# Patient Record
Sex: Female | Born: 2000 | Race: White | Hispanic: No | Marital: Single | Smoking: Former smoker
Health system: Southern US, Community
[De-identification: ages and names within clinical notes are randomized; demographics above are authoritative.]

## PROBLEM LIST (undated history)

## (undated) DIAGNOSIS — Z87440 Personal history of urinary (tract) infections: Secondary | ICD-10-CM

## (undated) DIAGNOSIS — Z9109 Other allergy status, other than to drugs and biological substances: Secondary | ICD-10-CM

## (undated) DIAGNOSIS — L509 Urticaria, unspecified: Secondary | ICD-10-CM

## (undated) DIAGNOSIS — F32A Depression, unspecified: Secondary | ICD-10-CM

## (undated) DIAGNOSIS — F329 Major depressive disorder, single episode, unspecified: Secondary | ICD-10-CM

## (undated) DIAGNOSIS — F909 Attention-deficit hyperactivity disorder, unspecified type: Secondary | ICD-10-CM

## (undated) DIAGNOSIS — T7840XA Allergy, unspecified, initial encounter: Secondary | ICD-10-CM

## (undated) DIAGNOSIS — L709 Acne, unspecified: Secondary | ICD-10-CM

## (undated) DIAGNOSIS — R010 Benign and innocent cardiac murmurs: Secondary | ICD-10-CM

## (undated) DIAGNOSIS — F419 Anxiety disorder, unspecified: Secondary | ICD-10-CM

## (undated) HISTORY — DX: Major depressive disorder, single episode, unspecified: F32.9

## (undated) HISTORY — DX: Attention-deficit hyperactivity disorder, unspecified type: F90.9

## (undated) HISTORY — PX: CLAVICLE SURGERY: SHX598

## (undated) HISTORY — DX: Allergy, unspecified, initial encounter: T78.40XA

## (undated) HISTORY — DX: Urticaria, unspecified: L50.9

## (undated) HISTORY — DX: Acne, unspecified: L70.9

## (undated) HISTORY — DX: Personal history of urinary (tract) infections: Z87.440

## (undated) HISTORY — DX: Depression, unspecified: F32.A

---
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
## 2021-01-02 DIAGNOSIS — Z719 Counseling, unspecified: Secondary | ICD-10-CM

## 2021-01-04 ENCOUNTER — Encounter: Payer: Self-pay | Admitting: Registered Nurse

## 2021-01-09 ENCOUNTER — Other Ambulatory Visit: Payer: Self-pay | Admitting: Registered Nurse

## 2021-01-09 DIAGNOSIS — R41840 Attention and concentration deficit: Secondary | ICD-10-CM

## 2021-01-09 MED ORDER — AMPHETAMINE-DEXTROAMPHET ER 10 MG PO CP24: 10.0000 mg | ORAL_CAPSULE | Freq: Every day | ORAL | 0 refills | Status: DC

## 2021-01-09 NOTE — Progress Notes (Signed)
Vyvanse not covered without PA - will send Adderall 10mg  XR PO qd instead, monitor effect   , NP

## 2021-01-24 ENCOUNTER — Encounter: Payer: Self-pay | Admitting: Registered Nurse

## 2021-02-04 DIAGNOSIS — N939 Abnormal uterine and vaginal bleeding, unspecified: Secondary | ICD-10-CM | POA: Diagnosis not present

## 2021-02-06 ENCOUNTER — Encounter: Payer: Self-pay | Admitting: Registered Nurse

## 2021-02-06 ENCOUNTER — Other Ambulatory Visit: Payer: Self-pay

## 2021-02-06 ENCOUNTER — Telehealth: Payer: Self-pay | Admitting: Registered Nurse

## 2021-02-06 VITALS — Ht 66.01 in | Wt 111.8 lb

## 2021-02-06 DIAGNOSIS — N946 Dysmenorrhea, unspecified: Secondary | ICD-10-CM

## 2021-02-06 DIAGNOSIS — R41840 Attention and concentration deficit: Secondary | ICD-10-CM

## 2021-02-06 MED ORDER — LISDEXAMFETAMINE DIMESYLATE 40 MG PO CAPS: 40.0000 mg | ORAL_CAPSULE | ORAL | 0 refills | Status: DC

## 2021-02-06 NOTE — Patient Instructions (Addendum)
Ms. Gwynne -   Always a pleasure to speak with you.  Sorry you're not doing great.  Vyvanse has been sent. Can pursue prior authorization now that we have "failed" adderall.  Sending pelvic ultrasound orders to: [facility] [address] [place], [place] [address] Tel: [phone] Fax: [phone] Email: [email or web address]   Sending lab orders to: [facility] [address] [place], [place] [address] Phone   [phone] Fax   [phone]   Would recommend calling to schedule for imaging, but walk in for labs should be ok.  Can give each location our fax # [phone] - especially the imaging center - to ensure I get results promptly  Thank you  Luan Pulling

## 2021-02-06 NOTE — Progress Notes (Signed)
Telemedicine Encounter- SOAP NOTE Established Patient  This telephone encounter was conducted with the patient's (or proxy's) verbal consent via audio telecommunications: yes/no: Yes Patient was instructed to have this encounter in a suitably private space; and to only have persons present to whom they give permission to participate. In addition, patient identity was confirmed by use of name plus two identifiers (DOB and address).  I discussed the limitations, risks, security and privacy concerns of performing an evaluation and management service by telephone and the availability of in person appointments. I also discussed with the patient that there may be a patient responsible charge related to this service. The patient expressed understanding and agreed to proceed.  I spent a total of  22 minutes talking with the patient or their proxy.  Patient at home Provider in office  Participants: Jari Sportsman, NP and Pearlean Boy  Chief Complaint  Patient presents with   ADHD   Medication Refill   Menstrual Problem    1 week early, heavy.    Fatigue    She complains of extreme fatigue. She says that she slept over 8 hours last night, and feels like she slept 2 hours.     Subjective   Kehinde Jentsch is a 20 y.o. established patient. Telephone visit today for med check, menstrual concerns, fatigue  HPI ADD Started Adderall 10mg  ER po qd on 12/14/20 - initially tried for vyvanse but unable to get this covered, so she had started later than anticipated. PDMP shows pick up of 30 tablets on 01/11/21. Unfortunately subtherapeutic effect - had titrated up to 30mg  PO qd - still not lasting long enough  Abnormal Menses Most recent period very heavy, lots of clotting. Hx of very regular menses - had been on COCs for some time but stopped two years ago, still regular until recent. LMP on 02/02/21 Cramping is severe - far worse on R side. Far worse than usual cramping. Recent visit to urgent care  showed negative for STI, negative for pregnancy   Is seen at [facility] - 04/04/21 Al[place], PA-C  Hx of anemia  Fatigue Sleeping quite a lot - 8-10 hours last night, but still very fatigued.  Only these past few nights Thinks it's related to her recent heavy menses.  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   Intractable cyclical vomiting with nausea 05/28/2017   Marijuana abuse 05/28/2017   Seasonal allergic rhinitis due to pollen 05/28/2017   Intermittent vomiting 01/13/2017   MDD (major depressive disorder), recurrent episode, severe (HCC) 07/31/2016   Anemia 04/17/2016   GAD (generalized anxiety disorder) 03/15/2016   Anaphylaxis 02/28/2016   Angioedema 02/28/2016   Urticaria 02/28/2016   Acne vulgaris 07/06/2015   Metrorrhagia 07/06/2015    Past Medical History:  Diagnosis Date   Acne    Allergy    Anxiety    Depression    Environmental allergies    History of recurrent UTIs    Hives    Innocent heart murmur     Current Outpatient Medications  Medication Sig Dispense Refill   albuterol (VENTOLIN HFA) 108 (90 Base) MCG/ACT inhaler INHALE 2 PUFFS BY MOUTH 10 TO 15 MINUTES BEFORE PHYSICAL ACTIVITY 6.7 g 3   amphetamine-dextroamphetamine (ADDERALL XR) 10 MG 24 hr capsule Take 1 capsule (10 mg total) by mouth daily. 30 capsule 0   doxycycline (VIBRA-TABS) 100 MG tablet Take 1 tablet (100 mg total) by mouth  2 (two) times daily. (Patient not taking: Reported on 02/06/2021) 14 tablet 0   fluconazole (DIFLUCAN) 150 MG tablet Take 2 tablets (300 mg total) by mouth once a week. (Patient not taking: Reported on 02/06/2021) 4 tablet 0   itraconazole (SPORANOX) 100 MG capsule Take 2 capsules (200 mg total) by mouth daily. (Patient not taking: Reported on 02/06/2021) 10 capsule 0   triamcinolone cream (KENALOG) 0.1 % Apply 1 application topically 2 (two) times daily. (Patient not taking: Reported on 02/06/2021) 45 g  2   No current facility-administered medications for this visit.    Allergies  Allergen Reactions   Other Hives and Anaphylaxis    Carries Epi-Pen for when she gets hives. Source of hives is undetermined despite testing. Ragweed    Social History   Socioeconomic History   Marital status: Single    Spouse name: Not on file   Number of children: 0   Years of education: Not on file   Highest education level: Not on file  Occupational History   Not on file  Tobacco Use   Smoking status: Former    Types: E-cigarettes   Smokeless tobacco: Never  Vaping Use   Vaping Use: Former  Substance and Sexual Activity   Alcohol use: Yes    Comment: Every now and then   Drug use: Yes    Types: Marijuana    Comment: none in several weeks   Sexual activity: Yes    Birth control/protection: Condom  Other Topics Concern   Not on file  Social History Narrative   Not on file   Social Determinants of Health   Financial Resource Strain: Not on file  Food Insecurity: Not on file  Transportation Needs: Not on file  Physical Activity: Not on file  Stress: Not on file  Social Connections: Not on file  Intimate Partner Violence: Not on file    ROS Per hpi   Objective   Vitals as reported by the patient: Today's Vitals   02/06/21 1557  Weight: 111 lb 12.4 oz (50.7 kg)  Height: 5' 6.01" (1.677 m)    There are no diagnoses linked to this encounter.  PLAN Will switch to Vyvanse as above. Pt has failed trial of adderall xr.  Heavy menses - given sharp unilateral pain, will order [place] to imaging center in [place] (where pt is currently living) to rule out torsion, ruptured cyst, etc. Labs to be ordered to [facility] in [place] as well. Instructed pt to give imaging and labs our fax number to ensure we get results. ER precautions reviewed Patient encouraged to call clinic with any questions, comments, or concerns.  I discussed the assessment and treatment plan with the patient. The  patient was provided an opportunity to ask questions and all were answered. The patient agreed with the plan and demonstrated an understanding of the instructions.   The patient was advised to call back or seek an in-person evaluation if the symptoms worsen or if the condition fails to improve as anticipated.  I provided 22 minutes of non-face-to-face time during this encounter.  [name], NP  Primary Care at [facility]

## 2021-02-14 ENCOUNTER — Telehealth: Payer: Self-pay | Admitting: Registered Nurse

## 2021-02-14 DIAGNOSIS — R41840 Attention and concentration deficit: Secondary | ICD-10-CM

## 2021-02-14 NOTE — Telephone Encounter (Signed)
Patient called - she is switched insurance coverages and her insurance will not go into effect until Monday at the latest.  Her vyvanse  will be $500 without insurance.  She wants to know if you can call in a rx for 7 days.  Can you send this into [facility] [employer] [facility]	[place].  She would like a higher dose of the Adderall - Vyvanse has not been filled yet and she will get in on Monday.

## 2021-02-17 NOTE — Telephone Encounter (Signed)
Patient is calling back for follow up on medication

## 2021-02-18 MED ORDER — AMPHETAMINE-DEXTROAMPHET ER 30 MG PO CP24: 30.0000 mg | ORAL_CAPSULE | ORAL | 0 refills | Status: DC

## 2021-02-18 NOTE — Telephone Encounter (Signed)
Noted last visit August 15 and prior visits reviewed. Had initially attempted Vyvanse, unable to get that covered, changed to Adderall 10 mg extended release, and had titrated up to 30 mg daily but not lasting long enough.  Switched back to Vyvanse at August 15 visit at 40 mg.  Controlled substance database reviewed, last filled dextroamphetamine extended release 10 mg caps #30 on 01/11/2021. Insurance recently changed, should be effective this Monday.  In school - just finished 1st week of classes [employer].  Struggling off meds past week with focus - putting off work.   Plan discussed with patient, although did not ideal relief with the 30 mg of Adderall, would at least like to try that for now until she is able to get Vyvanse covered.  1 week supply provided.  Recommended 6-week follow-up after starting Vyvanse with [name], NP.  Also recommended to proceed with the formal ADD evaluation as previously discussed.  All questions answered.

## 2021-02-19 ENCOUNTER — Encounter: Payer: Self-pay | Admitting: Registered Nurse

## 2021-02-19 DIAGNOSIS — R41840 Attention and concentration deficit: Secondary | ICD-10-CM

## 2021-02-21 MED ORDER — AMPHETAMINE-DEXTROAMPHET ER 30 MG PO CP24: 30.0000 mg | ORAL_CAPSULE | ORAL | 0 refills | Status: DC

## 2021-02-21 MED ORDER — LISDEXAMFETAMINE DIMESYLATE 40 MG PO CAPS: 40.0000 mg | ORAL_CAPSULE | ORAL | 0 refills | Status: DC

## 2021-02-21 NOTE — Telephone Encounter (Signed)
I called patient, apologize for the wrong pharmacy.  I have sent the Adderall XR 7-day supply to the [facility] as well as Vyvanse with instructions on timing of fill.  All questions answered.  Advised to let us know if Vyvanse will not be covered so we can make alternate arrangements.  Future message can be sent to her PCP, [name].

## 2021-02-21 NOTE — Telephone Encounter (Signed)
Patient needs adderall (7day) prescription and vyvanse prescription sent to [facility].

## 2021-02-23 ENCOUNTER — Encounter: Payer: Self-pay | Admitting: Registered Nurse

## 2021-02-26 DIAGNOSIS — Z20822 Contact with and (suspected) exposure to covid-19: Secondary | ICD-10-CM | POA: Diagnosis not present

## 2021-02-28 NOTE — Telephone Encounter (Signed)
Patient called back about prior authorization - Patient states that she needs to be able to pick up her medication today.  Please advise.

## 2021-02-28 NOTE — Telephone Encounter (Signed)
Having additional problems getting through. I keep getting a message telling me they are hanging up to try my call again later.

## 2021-02-28 NOTE — Telephone Encounter (Signed)
Called pharmacy to check status of PA, due to high wait time, had to hang up. Will check back later.

## 2021-03-01 ENCOUNTER — Telehealth: Payer: Self-pay

## 2021-03-01 NOTE — Telephone Encounter (Signed)
Wess a Teacher, music from [facility] called and asked to see if patient has taken or can take Focalin XR.   Albertina Parr said per Shanda Bumps that he needed to speak to Dr Neva Seat?   Call back number is [phone] Ref [id number]

## 2021-03-01 NOTE — Telephone Encounter (Signed)
Took call from [place] with [employer]. She was calling due to prior authorization for vyvanse being denied. [facility] is a peer to peer scheduled for 4:30 today, which was the latest. Determination will be made during call or shortly after.

## 2021-03-02 ENCOUNTER — Ambulatory Visit: Payer: Self-pay | Admitting: Registered Nurse

## 2021-03-02 ENCOUNTER — Telehealth: Payer: Self-pay

## 2021-03-02 ENCOUNTER — Other Ambulatory Visit: Payer: Self-pay | Admitting: Registered Nurse

## 2021-03-02 DIAGNOSIS — R41840 Attention and concentration deficit: Secondary | ICD-10-CM

## 2021-03-02 MED ORDER — DEXMETHYLPHENIDATE HCL ER 15 MG PO CP24: 15.0000 mg | ORAL_CAPSULE | Freq: Every day | ORAL | 0 refills | Status: DC

## 2021-03-02 NOTE — Telephone Encounter (Signed)
I received a call from patient and she was very upset about not getting her medication for a month. Patient states that the insurance company has been trying to get a peer to peer with our office and we have not done our part to insure she has her medication. Per the last encounters pharmacy at [facility] wanted to know if patient had taken Focalin XR in the past. Patient reports that she has had Focalin at some point, however, it did not work for her in the past and she would not like to be put back on it. I explained the insurance process to patient and advised her that we have been attempting to get this covered for her without success but we will continue to speak to insurance and I would update her.   I called insurance company and they stated a peer to peer is needed with [name], NP to be able to appeal this medication denial. They will be calling our office for peer to peer at 3pm today to discuss further in hopes of getting medication filled.   I gave patient an update through mychart and let her know we would communicate the status of her medications once we had more information from peer to peer.

## 2021-03-02 NOTE — Telephone Encounter (Signed)
Pt is wanting to know if she can get a week long adderall booster until you meet with the insurance company regarding the approval.  [id number] [place], [place] - [address] AT [facility] [address]   Pt call [phone]

## 2021-03-02 NOTE — Telephone Encounter (Signed)
Sent PA but Nehemiah Settle was spearheading. Now have sent Focalin XR.  Will reach out to patient directly to address her reasonable concern  Thank you  Rich

## 2021-03-02 NOTE — Telephone Encounter (Signed)
[employer] [place] [id number] Group [id number]

## 2021-03-16 ENCOUNTER — Ambulatory Visit: Payer: Managed Care, Other (non HMO) | Admitting: Plastic Surgery

## 2021-03-16 ENCOUNTER — Encounter: Payer: Managed Care, Other (non HMO) | Admitting: Plastic Surgery

## 2021-03-30 ENCOUNTER — Encounter: Payer: Self-pay | Admitting: Registered Nurse

## 2021-03-30 ENCOUNTER — Other Ambulatory Visit: Payer: Self-pay | Admitting: Family Medicine

## 2021-03-30 DIAGNOSIS — R41840 Attention and concentration deficit: Secondary | ICD-10-CM

## 2021-03-30 MED ORDER — LISDEXAMFETAMINE DIMESYLATE 40 MG PO CAPS: 40.0000 mg | ORAL_CAPSULE | ORAL | 0 refills | Status: DC

## 2021-03-31 NOTE — Telephone Encounter (Signed)
Medication was refilled yesterday according to her chart.

## 2021-04-06 ENCOUNTER — Other Ambulatory Visit: Payer: Self-pay

## 2021-04-06 ENCOUNTER — Emergency Department (HOSPITAL_COMMUNITY)
Admission: EM | Admit: 2021-04-06 | Discharge: 2021-04-07 | Disposition: A | Discharge status: Home / Self Care | Payer: BC Managed Care – PPO | Attending: Emergency Medicine | Admitting: Emergency Medicine

## 2021-04-06 ENCOUNTER — Encounter (HOSPITAL_COMMUNITY): Payer: Self-pay | Admitting: *Deleted

## 2021-04-06 DIAGNOSIS — S61431A Puncture wound without foreign body of right hand, initial encounter: Secondary | ICD-10-CM | POA: Diagnosis not present

## 2021-04-06 DIAGNOSIS — S01511A Laceration without foreign body of lip, initial encounter: Secondary | ICD-10-CM | POA: Insufficient documentation

## 2021-04-06 DIAGNOSIS — Z23 Encounter for immunization: Secondary | ICD-10-CM | POA: Insufficient documentation

## 2021-04-06 DIAGNOSIS — M7989 Other specified soft tissue disorders: Secondary | ICD-10-CM | POA: Diagnosis not present

## 2021-04-06 DIAGNOSIS — S0993XA Unspecified injury of face, initial encounter: Secondary | ICD-10-CM | POA: Diagnosis not present

## 2021-04-06 DIAGNOSIS — S0185XA Open bite of other part of head, initial encounter: Secondary | ICD-10-CM | POA: Diagnosis not present

## 2021-04-06 DIAGNOSIS — S1193XA Puncture wound without foreign body of unspecified part of neck, initial encounter: Secondary | ICD-10-CM | POA: Insufficient documentation

## 2021-04-06 DIAGNOSIS — S61451A Open bite of right hand, initial encounter: Secondary | ICD-10-CM | POA: Diagnosis not present

## 2021-04-06 DIAGNOSIS — S01412A Laceration without foreign body of left cheek and temporomandibular area, initial encounter: Secondary | ICD-10-CM | POA: Diagnosis not present

## 2021-04-06 DIAGNOSIS — Z87891 Personal history of nicotine dependence: Secondary | ICD-10-CM | POA: Insufficient documentation

## 2021-04-06 DIAGNOSIS — W540XXA Bitten by dog, initial encounter: Secondary | ICD-10-CM | POA: Insufficient documentation

## 2021-04-06 MED ORDER — ONDANSETRON 4 MG PO TBDP
4.0000 mg | ORAL_TABLET | Freq: Once | ORAL | Status: AC
  Administered 2021-04-06: 4 mg via ORAL
  Filled 2021-04-06: qty 1

## 2021-04-06 MED ORDER — OXYCODONE-ACETAMINOPHEN 5-325 MG PO TABS
1.0000 | ORAL_TABLET | Freq: Once | ORAL | Status: AC
Start: 2021-04-06 — End: 2021-04-06
  Administered 2021-04-06: 1 via ORAL
  Filled 2021-04-06: qty 1

## 2021-04-06 MED ORDER — LIDOCAINE-EPINEPHRINE (PF) 2 %-1:200000 IJ SOLN
20.0000 mL | Freq: Once | INTRAMUSCULAR | Status: AC
  Administered 2021-04-07: 20 mL
  Filled 2021-04-06: qty 20

## 2021-04-06 NOTE — ED Notes (Signed)
Pt's hand wound oozing blood around dressing. Dressing changed and wrapped with nonadherent, gauze, and coban.

## 2021-04-06 NOTE — ED Provider Notes (Addendum)
Emergency Medicine Provider Triage Evaluation Note  Sheryl Jackson , a 20 y.o. female  was evaluated in triage.  Pt complains of multiple puncture wounds secondary to a dog bite.  They were watching a dog for another family.  She arrived home from college when the dog jumped on her and latched onto the face.  She reports lacerations to the left lower mandible and upper lip.  She also has puncture wounds to the right hand and right neck. Dog was UTD on vaccines.   Review of Systems  Positive:  Negative: See above   Physical Exam  BP (!) 122/96 (BP Location: Right Arm)   Pulse 97   Resp 18   LMP 03/21/2021 (Approximate)   SpO2 100%  Gen:   Awake, no distress   Resp:  Normal effort  MSK:   Moves extremities without difficulty  Other:  3 puncture wounds to the right hand.  2 cm deep laceration to the left lower chin.  4 cm laceration to the upper lip does not cross vermilion border.  Medical Decision Making  Medically screening exam initiated at 9:12 PM.  Appropriate orders placed.  Sheryl Jackson was informed that the remainder of the evaluation will be completed by another provider, this initial triage assessment does not replace that evaluation, and the importance of remaining in the ED until their evaluation is complete.  Will need laceration repair to the face.  Family is requesting either plastics or ENT.   Teressa Lower, PA-C 04/06/21 2119    Teressa Lower, PA-C 04/06/21 2119    Benjiman Core, MD 04/08/21 1056

## 2021-04-06 NOTE — ED Triage Notes (Signed)
Pt has dog bite to left side of her face/lip and puncture wounds to right posterior hand. Bleeding is controlled at this time, pt is anxious at triage. Animal control was contacted pta and family reports dog is up to date on vaccines.

## 2021-04-06 NOTE — ED Provider Notes (Addendum)
[facility] EMERGENCY DEPARTMENT Provider Note   CSN: [id number] Arrival date & time: 04/06/21  2047     History Chief Complaint  Patient presents with   Animal Bite    Chauntae Krah is a 20 y.o. female.  Patient is a 21 year old female with no significant past medical history.  Patient presenting today with complaints of dog bite.  Patient and family recently took a new dog who became excited, then jumped and bit her in the face and right hand.  She has a complex laceration to the left upper lip and laceration to the right hand.  She also has a puncture to the anterior neck.  The history is provided by the patient.  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     Family History  Problem Relation Age of Onset   Cancer Maternal Grandfather    Stroke Maternal Grandfather    Stroke Paternal Grandmother    Breast cancer Paternal Grandmother    Cancer Paternal Grandfather    Pancreatic cancer  Paternal Grandfather     Social History   Tobacco Use   Smoking status: Former    Types: E-cigarettes   Smokeless tobacco: Never  Building services engineer Use: Former  Substance Use Topics   Alcohol use: Yes    Comment: Every now and then   Drug use: Yes    Types: Marijuana    Comment: none in several weeks    Home Medications Prior to Admission medications   Medication Sig Start Date End Date Taking? Authorizing Provider  albuterol (VENTOLIN HFA) 108 (90 Base) MCG/ACT inhaler INHALE 2 PUFFS BY MOUTH 10 TO 15 MINUTES BEFORE PHYSICAL ACTIVITY 06/27/20   Waldon Merl, PA-C  amphetamine-dextroamphetamine (ADDERALL XR) 30 MG 24 hr capsule Take 1 capsule (30 mg total) by mouth every morning. 02/21/21   Shade Flood, MD  dexmethylphenidate (FOCALIN XR) 15 MG 24 hr capsule Take 1 capsule (15 mg total) by mouth daily. 03/02/21   [name], NP  lisdexamfetamine (VYVANSE) 40 MG capsule Take 1 capsule (40 mg total) by mouth every morning. 03/30/21   [name], NP    Allergies    Other  Review of Systems   Review of Systems  All other systems reviewed and are negative.  Physical Exam Updated Vital Signs BP (!) 122/96 (BP Location: Right Arm)   Pulse 97  Resp 18   Ht 5\' 6"  (1.676 m)   Wt 49.9 kg   LMP 03/21/2021 (Approximate)   SpO2 100%   BMI 17.75 kg/m   Physical Exam Vitals and nursing note reviewed.  Constitutional:      General: She is not in acute distress.    Appearance: Normal appearance. She is not ill-appearing.  HENT:     Head: Normocephalic.     Mouth/Throat:     Comments: There is a complex laceration involving the corner of the mouth on the left extending through the upper lip. Neck:     Comments: There is a small puncture to the anterior neck.  There is no significant swelling.  Leading is controlled. Pulmonary:     Effort: Pulmonary effort is normal.  Skin:    General: Skin is warm and dry.  Neurological:     Mental Status: She is alert  and oriented to person, place, and time. Mental status is at baseline.      ED Results / Procedures / Treatments   Labs (all labs ordered are listed, but only abnormal results are displayed) Labs Reviewed - No data to display  EKG None  Radiology No results found.  Procedures Procedures   Medications Ordered in ED Medications  oxyCODONE-acetaminophen (PERCOCET/ROXICET) 5-325 MG per tablet 1 tablet (1 tablet Oral Given 04/06/21 2125)  ondansetron (ZOFRAN-ODT) disintegrating tablet 4 mg (4 mg Oral Given 04/06/21 2126)    ED Course  I have reviewed the triage vital signs and the nursing notes.  Pertinent labs & imaging results that were available during my care of the patient were reviewed by me and considered in my medical decision making (see chart for details).    MDM Rules/Calculators/A&P  Patient with complex facial laceration as described in the physical exam resulting from a dog bite.  Family adamant about plastic surgery being involved and making the repair.  Care discussed with Dr. 2127 from plastic surgery who has come and evaluated patient.  She is very anxious and Dr. Domenica Reamer and I both believe that conscious sedation with ketamine would allow for the most cooperation from the patient and also provide the best result.  This was performed using 4 mg/kg of ketamine and laceration was repaired.  Patient given tetanus shot, Augmentin, and will be discharged with Augmentin and pain medication.  To follow-up with plastic surgery as recommended.  There are also puncture wounds to the right hand and a small puncture wound to the anterior aspect of the neck that involves the superficial tissues and does not appear to penetrate the platysma.  Final Clinical Impression(s) / ED Diagnoses Final diagnoses:  None    Rx / DC Orders ED Discharge Orders     None        Domenica Reamer, MD 04/07/21 04/09/21    0630, MD 04/07/21 [phone]

## 2021-04-07 ENCOUNTER — Emergency Department (HOSPITAL_COMMUNITY): Payer: BC Managed Care – PPO

## 2021-04-07 DIAGNOSIS — M7989 Other specified soft tissue disorders: Secondary | ICD-10-CM | POA: Diagnosis not present

## 2021-04-07 DIAGNOSIS — S0185XA Open bite of other part of head, initial encounter: Secondary | ICD-10-CM

## 2021-04-07 DIAGNOSIS — S61451A Open bite of right hand, initial encounter: Secondary | ICD-10-CM | POA: Diagnosis not present

## 2021-04-07 MED ORDER — HYDROCODONE-ACETAMINOPHEN 5-325 MG PO TABS: 1.0000 | ORAL_TABLET | Freq: Four times a day (QID) | ORAL | 0 refills | Status: DC | PRN

## 2021-04-07 MED ORDER — LORAZEPAM 2 MG/ML IJ SOLN
1.0000 mg | Freq: Once | INTRAMUSCULAR | Status: AC
  Administered 2021-04-07: 1 mg via INTRAVENOUS
  Filled 2021-04-07: qty 1

## 2021-04-07 MED ORDER — KETAMINE HCL 50 MG/ML IJ SOLN
4.0000 mg/kg | Freq: Once | INTRAMUSCULAR | Status: AC
  Administered 2021-04-07: 200 mg via INTRAMUSCULAR
  Filled 2021-04-07: qty 1

## 2021-04-07 MED ORDER — KETAMINE HCL 10 MG/ML IJ SOLN
INTRAMUSCULAR | Status: AC | PRN
  Administered 2021-04-07: 50 mg via INTRAVENOUS

## 2021-04-07 MED ORDER — AMOXICILLIN-POT CLAVULANATE 500-125 MG PO TABS: 1.0000 | ORAL_TABLET | Freq: Three times a day (TID) | ORAL | 0 refills | Status: DC

## 2021-04-07 MED ORDER — HYDROMORPHONE HCL 1 MG/ML IJ SOLN
2.0000 mg | Freq: Once | INTRAMUSCULAR | Status: DC
Start: 2021-04-07 — End: 2021-04-07

## 2021-04-07 MED ORDER — TETANUS-DIPHTH-ACELL PERTUSSIS 5-2.5-18.5 LF-MCG/0.5 IM SUSY
0.5000 mL | PREFILLED_SYRINGE | Freq: Once | INTRAMUSCULAR | Status: AC
  Administered 2021-04-07: 0.5 mL via INTRAMUSCULAR
  Filled 2021-04-07: qty 0.5

## 2021-04-07 NOTE — Consult Note (Signed)
Reason for Consult/CC: dog bite to face  Sheryl Jackson is an 20 y.o. female.  HPI: Sheryl Jackson is a 20 year old who sustained a dog bite to the face, right hand, and right neck today from the family dog.  The dogs vaccines were up to date.  The patient is a Archivist.  She does use tobacco products.  She has allergy to ragweed but no medications.  She denies difficulty with ROM with right hand.  No shortness of breath or difficulty swallowing.  Allergies:  Allergies  Allergen Reactions   Other Hives and Anaphylaxis    Carries Epi-Pen for when she gets hives. Source of hives is undetermined despite testing. Ragweed    Medications:  Current Facility-Administered Medications:    lidocaine-EPINEPHrine (XYLOCAINE W/EPI) 2 %-1:200000 (PF) injection 20 mL, 20 mL, Infiltration, Once, Delo, Douglas, MD  Current Outpatient Medications:    albuterol (VENTOLIN HFA) 108 (90 Base) MCG/ACT inhaler, INHALE 2 PUFFS BY MOUTH 10 TO 15 MINUTES BEFORE PHYSICAL ACTIVITY (Patient taking differently: Inhale 2 puffs into the lungs See admin instructions. 10 TO 15 MINUTES BEFORE PHYSICAL ACTIVITY), Disp: 6.7 g, Rfl: 3   lisdexamfetamine (VYVANSE) 40 MG capsule, Take 1 capsule (40 mg total) by mouth every morning., Disp: 30 capsule, Rfl: 0   amphetamine-dextroamphetamine (ADDERALL XR) 30 MG 24 hr capsule, Take 1 capsule (30 mg total) by mouth every morning. (Patient not taking: No sig reported), Disp: 7 capsule, Rfl: 0   dexmethylphenidate (FOCALIN XR) 15 MG 24 hr capsule, Take 1 capsule (15 mg total) by mouth daily. (Patient not taking: No sig reported), Disp: 30 capsule, Rfl: 0  Past Medical History:  Diagnosis Date   Acne    Allergy    Anxiety    Depression    Environmental allergies    History of recurrent UTIs    Hives    Innocent heart murmur     Past Surgical History:  Procedure Laterality Date   CLAVICLE SURGERY     WISDOM TOOTH EXTRACTION  2020    Family History  Problem Relation Age of Onset    Cancer Maternal Grandfather    Stroke Maternal Grandfather    Stroke Paternal Grandmother    Breast cancer Paternal Grandmother    Cancer Paternal Grandfather    Pancreatic cancer Paternal Grandfather     Social History:  reports that she has quit smoking. Her smoking use included e-cigarettes. She has never used smokeless tobacco. She reports current alcohol use. She reports current drug use. Drug: Marijuana.  Physical Exam Blood pressure 107/68, pulse 83, resp. rate 14, height 5\' 6"  (1.676 m), weight 49.9 kg, last menstrual period 03/21/2021, SpO2 100 %. General: Alert, anxious Heent:  left lip laceration 2 cm, crosses the vermilion, muscle visible, adjacent left cheek lacerations 1.5 cm  No results found for this or any previous visit (from the past 48 hour(s)).  DG Hand Complete Right  Result Date: 04/07/2021 CLINICAL DATA:  Status post dog bite. EXAM: RIGHT HAND - COMPLETE 3+ VIEW COMPARISON:  None. FINDINGS: There is no evidence of fracture or dislocation. There is no evidence of arthropathy or other focal bone abnormality. Mild dorsal soft tissue swelling is seen along the proximal metacarpals. IMPRESSION: Mild dorsal soft tissue swelling without evidence of an acute osseous abnormality. Electronically Signed   By: 04/09/2021 M.D.   On: 04/07/2021 01:18    Assessment/Plan: 20 year old with dog bite to left lip and cheek -repair in ED -tetanus per ED  recs -Augmentin po on discharge -May follow-up with me at [facility], Friday 04/14/21, call [phone] for appointment.  Sheryl Jackson 04/07/2021, 1:21 AM

## 2021-04-07 NOTE — Progress Notes (Signed)
Attended conscious sedation. Pt unable to wear [place] with end tidal during procedure but placed on pre and post procedure. Blow by via NRB was provided during procedure due to slight dip in SPo2 of 85%. Pre procedure CO2 of 22 and Post procedure Co2 of 35. Pt spo2 now 94% on 2L [place].

## 2021-04-07 NOTE — Discharge Instructions (Addendum)
Begin taking Augmentin as prescribed.  Take hydrocodone as prescribed as needed for pain.  Local wound care with bacitracin and dressing changes twice daily.  Follow-up with Dr. Domenica Reamer as per his recommendations, and return to the ER if you experience any new and/or concerning symptoms.

## 2021-04-07 NOTE — Procedures (Signed)
Operative Note   DATE OF OPERATION: 04/07/2021  SURGICAL DEPARTMENT: Plastic Surgery  PREOPERATIVE DIAGNOSES:  left upper lip and cheek laceration  POSTOPERATIVE DIAGNOSES:  same  PROCEDURE:   1) 2 cm repair left upper lip, complex with repair of muscle 2) 1.5 cm repair of left cheek, intermediate closure  SURGEON: Janne Napoleon, MD  ASSISTANT: none  ANESTHESIA:  General.   COMPLICATIONS: None.   INDICATIONS FOR PROCEDURE:  The patient, Sheryl Jackson is a 20 y.o. female born on Sep 14, 2000, is here for treatment of a left upper lip and cheek dog bite.  Suture repair is necessary. MRN: [id number]  CONSENT:  Informed consent was obtained directly from the patient. Risks, benefits and alternatives were fully discussed. Specific risks including but not limited to bleeding, infection, hematoma, seroma, scarring, pain, contracture, asymmetry, wound healing problems, and need for further surgery were all discussed. The patient did have an ample opportunity to have questions answered to satisfaction.   DESCRIPTION OF PROCEDURE:  The patient was treated in an ED.  Sedation was administered.  The patient's operative site was prepped and draped in a sterile fashion. A time out was performed and all information was confirmed to be correct.  The area was injected with local anesthetic 1% lidocaine with epinephrine.  The area was irrigated with copious saline.  A deep layer of 5-0 monocryl was used to approximate the vermilion border and suture the muscle for the lip closure and then the skin was closed with 5-0 prolene.  The left cheek was then closed with 5-0 monocryl for a deep suture and 5-0 prolene for skin.  Wounds were well approximated and closure was satisfactory.   The patient tolerated the procedure well.  There were no complications. The patient was allowed to wake from anesthesia, extubated and taken to the recovery room in satisfactory condition.

## 2021-04-13 ENCOUNTER — Telehealth: Payer: Self-pay

## 2021-04-13 NOTE — Telephone Encounter (Signed)
Returned mothers call. Patient has a follow up appointment tomorrow to observe repair to laceration of the face from dog bite with Dr. Domenica Reamer on 04/07/2021. Assured Dr. Domenica Reamer will take time to answer questions, and discuss options for further treating and healing.

## 2021-04-13 NOTE — Telephone Encounter (Signed)
Patient's mom Aram Beecham) called to say that the patient is extremely anxious about her injury and was panicking yesterday.  Please call Aram Beecham.  She would like to be sure that Dr. Domenica Reamer will have time to look at her daughter tomorrow and discuss what can be done to help her.

## 2021-04-14 ENCOUNTER — Ambulatory Visit: Payer: BC Managed Care – PPO | Admitting: Plastic Surgery

## 2021-04-14 ENCOUNTER — Encounter: Payer: Self-pay | Admitting: Plastic Surgery

## 2021-04-14 ENCOUNTER — Other Ambulatory Visit: Payer: Self-pay

## 2021-04-14 DIAGNOSIS — Z719 Counseling, unspecified: Secondary | ICD-10-CM

## 2021-04-14 DIAGNOSIS — S0993XD Unspecified injury of face, subsequent encounter: Secondary | ICD-10-CM

## 2021-04-14 NOTE — Progress Notes (Signed)
Patient is status post dog bite to face on the left lip and left cheek repaired on 04/07/2021.  She was doing well without complaints.  No fever no chills.  Physical exam Incisions clean dry intact, no erythema, no drainage.  The vermilion border appears to be well approximated.  1 cm intraoral laceration to the left buccal mucosa that is healing well.  Assessment and plan Healing well after dog bite to face.  Anxious but sutures were removed.  We discussed scar massage sunscreen and the healing process of the scar where it may take over a year for the optimal cosmetic result.  See her back for a recheck prior to Thanksgiving when she will be back from college.

## 2021-05-01 ENCOUNTER — Encounter: Payer: Self-pay | Admitting: Registered Nurse

## 2021-05-01 ENCOUNTER — Other Ambulatory Visit: Payer: Self-pay | Admitting: Registered Nurse

## 2021-05-01 DIAGNOSIS — R41840 Attention and concentration deficit: Secondary | ICD-10-CM

## 2021-05-01 MED ORDER — LISDEXAMFETAMINE DIMESYLATE 40 MG PO CAPS: 40.0000 mg | ORAL_CAPSULE | ORAL | 0 refills | Status: DC

## 2021-05-01 NOTE — Telephone Encounter (Signed)
Patient is requesting a refill of the following medications: Requested Prescriptions   Pending Prescriptions Disp Refills   lisdexamfetamine (VYVANSE) 40 MG capsule 30 capsule 0    Sig: Take 1 capsule (40 mg total) by mouth every morning.    Date of patient request: 05/01/2021 Last office visit: 12/14/2020 Date of last refill: 03/30/2021 Last refill amount: 30 capsule  Follow up time period per chart:

## 2021-05-15 ENCOUNTER — Ambulatory Visit: Payer: BC Managed Care – PPO | Admitting: Plastic Surgery

## 2021-05-15 ENCOUNTER — Other Ambulatory Visit: Payer: Self-pay

## 2021-05-15 DIAGNOSIS — S0993XD Unspecified injury of face, subsequent encounter: Secondary | ICD-10-CM

## 2021-05-17 ENCOUNTER — Encounter: Payer: Managed Care, Other (non HMO) | Admitting: Plastic Surgery

## 2021-05-17 NOTE — Progress Notes (Signed)
S/p lip closure, doing well, feels initial cosmesis is good  PE Incisons c/d/I   A/P Will see the patient later to discuss any needs for revision or possible lip filler.

## 2021-05-29 ENCOUNTER — Other Ambulatory Visit: Payer: Self-pay | Admitting: Registered Nurse

## 2021-05-29 DIAGNOSIS — R41840 Attention and concentration deficit: Secondary | ICD-10-CM

## 2021-05-30 ENCOUNTER — Encounter: Payer: Self-pay | Admitting: Registered Nurse

## 2021-05-30 ENCOUNTER — Other Ambulatory Visit: Payer: Self-pay | Admitting: Registered Nurse

## 2021-05-30 DIAGNOSIS — R41840 Attention and concentration deficit: Secondary | ICD-10-CM

## 2021-05-30 MED ORDER — LISDEXAMFETAMINE DIMESYLATE 50 MG PO CAPS: 50.0000 mg | ORAL_CAPSULE | Freq: Every day | ORAL | 0 refills | Status: DC

## 2021-05-30 NOTE — Progress Notes (Signed)
Pt requesting dose increase on vyvanse. Subtherapeutic effect. Will increase to 50mg  from 40mg  and monitor effect.  , NP

## 2021-06-02 ENCOUNTER — Other Ambulatory Visit: Payer: Self-pay | Admitting: Registered Nurse

## 2021-06-02 DIAGNOSIS — R41840 Attention and concentration deficit: Secondary | ICD-10-CM

## 2021-06-02 MED ORDER — LISDEXAMFETAMINE DIMESYLATE 50 MG PO CAPS: 50.0000 mg | ORAL_CAPSULE | Freq: Every day | ORAL | 0 refills | Status: DC

## 2021-06-02 NOTE — Telephone Encounter (Signed)
Sent Thanks Rich

## 2021-06-12 ENCOUNTER — Other Ambulatory Visit: Payer: Self-pay | Admitting: Registered Nurse

## 2021-06-12 DIAGNOSIS — R41840 Attention and concentration deficit: Secondary | ICD-10-CM

## 2021-06-12 MED ORDER — LISDEXAMFETAMINE DIMESYLATE 50 MG PO CAPS: 50.0000 mg | ORAL_CAPSULE | Freq: Every day | ORAL | 0 refills | Status: DC

## 2021-06-12 NOTE — Telephone Encounter (Signed)
Sent Thanks Rich

## 2021-06-12 NOTE — Telephone Encounter (Signed)
Called the Pharmacy and they stated that she was able to get the medication on 06/02/2021 for 50.00 at the pharmacy but needs a new prescription for next month and he would send the PA over.

## 2021-06-28 ENCOUNTER — Encounter: Payer: Self-pay | Admitting: Registered Nurse

## 2021-06-28 NOTE — Telephone Encounter (Signed)
Forms have been placed in the back bin

## 2021-06-29 ENCOUNTER — Other Ambulatory Visit: Payer: Self-pay | Admitting: Registered Nurse

## 2021-06-29 DIAGNOSIS — R41840 Attention and concentration deficit: Secondary | ICD-10-CM

## 2021-06-30 MED ORDER — LISDEXAMFETAMINE DIMESYLATE 50 MG PO CAPS: 50.0000 mg | ORAL_CAPSULE | Freq: Every day | ORAL | 0 refills | Status: DC

## 2021-06-30 NOTE — Telephone Encounter (Signed)
Patient is requesting a refill of the following medications: Requested Prescriptions   Pending Prescriptions Disp Refills   lisdexamfetamine (VYVANSE) 50 MG capsule 30 capsule 0    Sig: Take 1 capsule (50 mg total) by mouth daily.    Date of patient request: 06/30/21 Last office visit: 02/06/21 Date of last refill: 06/12/21 Last refill amount: 30

## 2021-06-30 NOTE — Telephone Encounter (Signed)
Patient paperwork is upfront waiting for patient to pick up also has been faxed

## 2021-06-30 NOTE — Telephone Encounter (Signed)
Pt has responded with accomodation specifics as requested

## 2021-06-30 NOTE — Telephone Encounter (Signed)
Pt will want to pick up from reception

## 2021-07-13 DIAGNOSIS — D649 Anemia, unspecified: Secondary | ICD-10-CM | POA: Diagnosis not present

## 2021-07-13 DIAGNOSIS — N644 Mastodynia: Secondary | ICD-10-CM | POA: Diagnosis not present

## 2021-07-13 DIAGNOSIS — F419 Anxiety disorder, unspecified: Secondary | ICD-10-CM | POA: Diagnosis not present

## 2021-07-31 ENCOUNTER — Other Ambulatory Visit: Payer: Self-pay | Admitting: Registered Nurse

## 2021-07-31 DIAGNOSIS — R41840 Attention and concentration deficit: Secondary | ICD-10-CM

## 2021-08-01 DIAGNOSIS — N6325 Unspecified lump in the left breast, overlapping quadrants: Secondary | ICD-10-CM | POA: Diagnosis not present

## 2021-08-01 DIAGNOSIS — N644 Mastodynia: Secondary | ICD-10-CM | POA: Diagnosis not present

## 2021-08-03 ENCOUNTER — Other Ambulatory Visit: Payer: Self-pay | Admitting: Registered Nurse

## 2021-08-03 DIAGNOSIS — R41840 Attention and concentration deficit: Secondary | ICD-10-CM

## 2021-08-03 NOTE — Telephone Encounter (Signed)
Patient is requesting a refill of the following medications: Requested Prescriptions   Pending Prescriptions Disp Refills   lisdexamfetamine (VYVANSE) 50 MG capsule 30 capsule 0    Sig: Take 1 capsule (50 mg total) by mouth daily.    Date of patient request: 07/31/2021 Last office visit: 01/29/2021 Date of last refill: 06/30/2021 Last refill amount: 30 capsules Follow up time period per chart: none

## 2021-08-04 NOTE — Telephone Encounter (Signed)
Medication refill request was sent 08/03/2021

## 2021-08-05 ENCOUNTER — Other Ambulatory Visit: Payer: Self-pay | Admitting: Registered Nurse

## 2021-08-05 ENCOUNTER — Encounter: Payer: Self-pay | Admitting: Registered Nurse

## 2021-08-05 DIAGNOSIS — R41840 Attention and concentration deficit: Secondary | ICD-10-CM

## 2021-08-07 ENCOUNTER — Telehealth: Payer: Self-pay | Admitting: Registered Nurse

## 2021-08-07 MED ORDER — LISDEXAMFETAMINE DIMESYLATE 50 MG PO CAPS: 50.0000 mg | ORAL_CAPSULE | Freq: Every day | ORAL | 0 refills | Status: DC

## 2021-08-07 NOTE — Telephone Encounter (Signed)
Lvm for patient letting her know that the rx had been sent to the [facility] [address]

## 2021-08-07 NOTE — Telephone Encounter (Signed)
Rx sent to the [facility] [address] as requested

## 2021-08-07 NOTE — Telephone Encounter (Signed)
Chief Complaint Prescription Refill or Medication Request (non symptomatic) Reason for Call Medication Question / Request Initial Comment Caller states her prescription got sent to the wrong pharmacy. Translation No Nurse Assessment Nurse: Jerene Bears, RN, Will Date/Time Eilene Ghazi Time): 08/05/2021 1:21:36 PM Confirm and document reason for call. If symptomatic, describe symptoms. ---Caller reports that she had prescription refill for vyvanse sent to wrong pharmacy. No symptoms. Caller advised to request pharmacy transfer or call office when open on Monday for request. Caller also advised to make sure that office has correct pharmacy on file. Does the patient have any new or worsening symptoms? ---No Disp. Time Eilene Ghazi Time) Disposition Final User 08/05/2021 1:24:58 PM Clinical Call Yes Jerene Bears, RN, Will

## 2021-08-07 NOTE — Telephone Encounter (Signed)
Patient is requesting a refill of the following medications: Requested Prescriptions   Pending Prescriptions Disp Refills   lisdexamfetamine (VYVANSE) 50 MG capsule 30 capsule 0    Sig: Take 1 capsule (50 mg total) by mouth daily.    Date of patient request: 08/05/21 Last office visit: 02/06/21 Date of last refill: 06/30/21 Last refill amount: 30   FYI: Faby is following up on patients MyChart Messages from this past weekend

## 2021-08-10 ENCOUNTER — Encounter: Payer: BC Managed Care – PPO | Admitting: Plastic Surgery

## 2021-08-24 DIAGNOSIS — N6321 Unspecified lump in the left breast, upper outer quadrant: Secondary | ICD-10-CM | POA: Diagnosis not present

## 2021-08-24 DIAGNOSIS — Z803 Family history of malignant neoplasm of breast: Secondary | ICD-10-CM | POA: Diagnosis not present

## 2021-08-24 DIAGNOSIS — R928 Other abnormal and inconclusive findings on diagnostic imaging of breast: Secondary | ICD-10-CM | POA: Diagnosis not present

## 2021-08-25 ENCOUNTER — Encounter: Payer: Self-pay | Admitting: Registered Nurse

## 2021-08-26 DIAGNOSIS — F17200 Nicotine dependence, unspecified, uncomplicated: Secondary | ICD-10-CM | POA: Diagnosis not present

## 2021-08-26 DIAGNOSIS — J4521 Mild intermittent asthma with (acute) exacerbation: Secondary | ICD-10-CM | POA: Diagnosis not present

## 2021-08-26 DIAGNOSIS — F902 Attention-deficit hyperactivity disorder, combined type: Secondary | ICD-10-CM | POA: Diagnosis not present

## 2021-08-26 DIAGNOSIS — Z681 Body mass index (BMI) 19 or less, adult: Secondary | ICD-10-CM | POA: Diagnosis not present

## 2021-08-27 ENCOUNTER — Other Ambulatory Visit: Payer: Self-pay

## 2021-08-27 MED ORDER — ALBUTEROL SULFATE HFA 108 (90 BASE) MCG/ACT IN AERS: INHALATION_SPRAY | RESPIRATORY_TRACT | 3 refills | Status: DC

## 2021-08-27 NOTE — Telephone Encounter (Signed)
Medication has been sent to the pharmacy. 

## 2021-08-29 ENCOUNTER — Telehealth (INDEPENDENT_AMBULATORY_CARE_PROVIDER_SITE_OTHER): Payer: BC Managed Care – PPO | Admitting: Registered Nurse

## 2021-08-29 DIAGNOSIS — R41840 Attention and concentration deficit: Secondary | ICD-10-CM | POA: Diagnosis not present

## 2021-08-29 DIAGNOSIS — G479 Sleep disorder, unspecified: Secondary | ICD-10-CM

## 2021-08-29 DIAGNOSIS — J4540 Moderate persistent asthma, uncomplicated: Secondary | ICD-10-CM | POA: Diagnosis not present

## 2021-08-29 MED ORDER — LISDEXAMFETAMINE DIMESYLATE 50 MG PO CAPS: 50.0000 mg | ORAL_CAPSULE | Freq: Every day | ORAL | 0 refills | Status: DC

## 2021-08-29 MED ORDER — MIRTAZAPINE 7.5 MG PO TABS: 7.5000 mg | ORAL_TABLET | Freq: Every day | ORAL | 3 refills | Status: DC

## 2021-08-29 MED ORDER — ALBUTEROL SULFATE HFA 108 (90 BASE) MCG/ACT IN AERS: INHALATION_SPRAY | RESPIRATORY_TRACT | 11 refills | Status: DC

## 2021-08-29 NOTE — Progress Notes (Signed)
? ? ?Telemedicine Encounter- SOAP NOTE Established Patient ? ?This telephone encounter was conducted with the patient's (or proxy's) verbal consent via audio telecommunications: yes/no: Yes ?Patient was instructed to have this encounter in a suitably private space; and to only have persons present to whom they give permission to participate. In addition, patient identity was confirmed by use of name plus two identifiers (DOB and address).  I discussed the limitations, risks, security and privacy concerns of performing an evaluation and management service by telephone and the availability of in person appointments. I also discussed with the patient that there may be a patient responsible charge related to this service. The patient expressed understanding and agreed to proceed. ? ?I spent a total of 16 minutes talking with the patient or their proxy. ? ?Patient at home ?Provider in office ? ?Participants: Jari Sportsman, NP and Jozy Madrid ? ?Chief Complaint  ?Patient presents with  ? ADHD  ?  Discuss use of Vyvanse  ? Medication Refill  ?  Pt had recent refill of albuterol  ? ? ?Subjective  ? ?Sheryl Jackson is a 21 y.o. established patient. Telephone visit today for ADHD ? ?HPI ?ADHD ?Doing well with vyvanse 50mg  po qd ?No AE. Take most days.  ?Notes she is going abroad [place] 5 - [place] 8. Wants to ensure she can line up prescriptions with this trip so she does not run out. ?Would prefer 90 day supply if possible but understands this is insurance's determination. ? ?Sleep disturbance ?At times. 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 ?Asthma ?Albuterol controls sufficiently with prn use ?Would like refills ?Needed this weekend and had to get at an urgent care.  ?No AE in past.  ? ? ?Patient Active Problem List  ? Diagnosis Date Noted  ?  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 Intractable cyclical vomiting with nausea 05/28/2017  ? Marijuana abuse 05/28/2017  ? Seasonal allergic rhinitis due to pollen 05/28/2017  ? Intermittent vomiting 01/13/2017  ? MDD (major depressive disorder), recurrent episode, severe (HCC) 07/31/2016  ? Anemia 04/17/2016  ? GAD (generalized anxiety disorder) 03/15/2016  ? Anaphylaxis 02/28/2016  ? Angioedema 02/28/2016  ? Urticaria 02/28/2016  ? Acne vulgaris 07/06/2015  ? Metrorrhagia 07/06/2015  ? ? ?Past Medical History:  ?Diagnosis Date  ? Acne   ? Allergy   ? Anxiety   ? Depression   ? Environmental allergies   ? History of recurrent UTIs   ? Hives   ? Innocent heart murmur   ? ? ?Current Outpatient Medications  ?Medication Sig Dispense Refill  ? mirtazapine (REMERON) 7.5 MG tablet Take 1 tablet (7.5 mg total) by mouth at bedtime. 90 tablet 3  ? albuterol (VENTOLIN HFA) 108 (90 Base) MCG/ACT inhaler INHALE 2 PUFFS BY MOUTH 10 TO 15 MINUTES BEFORE PHYSICAL ACTIVITY 18 g 11  ? lisdexamfetamine (VYVANSE) 50 MG capsule Take 1 capsule (50 mg total) by mouth daily. 90 capsule 0  ? ?No current facility-administered medications for this visit.  ? ? ?Allergies  ?Allergen Reactions  ? Other Hives and Anaphylaxis  ?  Carries Epi-Pen for when she gets hives. Source of hives is undetermined despite testing. ?Ragweed  ? ? ?Social History  ? ?Socioeconomic History  ? Marital status: Single  ?  Spouse name:  Not on file  ? Number of children: 0  ? Years of education: Not on file  ? Highest education level: Not on file  ?Occupational History  ? Not on file  ?Tobacco Use  ? Smoking status: Former  ?  Types: E-cigarettes  ? Smokeless tobacco: Never  ?Vaping Use  ? Vaping Use: Former  ?Substance and Sexual Activity  ? Alcohol use: Yes  ?  Comment: Every now and then  ? Drug use: Yes  ?  Types: Marijuana  ?  Comment: none in several weeks  ? Sexual activity: Yes  ?  Birth control/protection: Condom  ?Other Topics Concern  ? Not  on file  ?Social History Narrative  ? Not on file  ? ?Social Determinants of Health  ? ?Financial Resource Strain: Not on file  ?Food Insecurity: Not on file  ?Transportation Needs: Not on file  ?Physical Activity: Not on file  ?Stress: Not on file  ?Social Connections: Not on file  ?Intimate Partner Violence: Not on file  ? ? ?ROS ?Per hpi  ? ?Objective  ? ?Vitals as reported by the patient: ?There were no vitals filed for this visit. ? ?Tyne was seen today for adhd and medication refill. ? ?Diagnoses and all orders for this visit: ? ?Moderate persistent extrinsic asthma without complication ?-     albuterol (VENTOLIN HFA) 108 (90 Base) MCG/ACT inhaler; INHALE 2 PUFFS BY MOUTH 10 TO 15 MINUTES BEFORE PHYSICAL ACTIVITY ? ?Concentration deficit ?-     lisdexamfetamine (VYVANSE) 50 MG capsule; Take 1 capsule (50 mg total) by mouth daily. ? ?Sleep disturbance ?-     mirtazapine (REMERON) 7.5 MG tablet; Take 1 tablet (7.5 mg total) by mouth at bedtime. ? ? ?PLAN ?Refill vyvanse, albuterol, and mirtazapine ?Med check in 6 mo at the latest. ?Patient encouraged to call clinic with any questions, comments, or concerns. ? ?I discussed the assessment and treatment plan with the patient. The patient was provided an opportunity to ask questions and all were answered. The patient agreed with the plan and demonstrated an understanding of the instructions. ?  ?The patient was advised to call back or seek an in-person evaluation if the symptoms worsen or if the condition fails to improve as anticipated. ? ?I provided 23 minutes of face-to-face time during this encounter. ? ?[name], NP ? ?

## 2021-08-30 ENCOUNTER — Encounter: Payer: Self-pay | Admitting: Plastic Surgery

## 2021-08-31 ENCOUNTER — Other Ambulatory Visit: Payer: Self-pay

## 2021-08-31 ENCOUNTER — Ambulatory Visit (INDEPENDENT_AMBULATORY_CARE_PROVIDER_SITE_OTHER): Payer: Self-pay | Admitting: Plastic Surgery

## 2021-08-31 DIAGNOSIS — Z411 Encounter for cosmetic surgery: Secondary | ICD-10-CM

## 2021-08-31 NOTE — Progress Notes (Signed)
Patient presents in follow-up for and interested in filler to her nose and lips.  I previously done some injections of filler to her nasal dorsum and tip for contour refinements which she is very happy with.  She feels that the tip projection has decreased slightly since I saw her last and she is interested in a small amount to be placed in that area today.  Unfortunately also since have seen her last she was bit by a dog in her left upper lip near the oral commissure and required a repair but she seems to be healing nicely from that.  She is also interested in lip filler to fill out some asymmetries in the lower lip and for additional volume and shaping of the upper lip.  We reviewed the risks and benefits of filler injection she is interested in moving forward.  She had some pre-existing Restylane-L which I had used for her nose which had been refrigerated and we use that today for the nasal tip.  About 0.2 cc or less was injected midline in the nasal tip for some refinement and to add a supratip break which gave a nice but subtle improvement.  For the lips we used Restylane kiss a total of about 0.7 cc.  After prepping the lips with an alcohol pad this was injected to fill out the right lower lip which was a little bit deficient in volume and shape compared to the left and additionally we added some volume to the oral commissures and across the upper lip with the exception of the tubercle.  She tolerated this fine and is happy with the result.  We were able to save and package the remaining wrestling-L and the remaining Restylane casts to be used at a future visit if necessary.  We will plan to see her again on an as-needed basis.  All her questions were answered. ?

## 2021-09-04 ENCOUNTER — Other Ambulatory Visit: Payer: Self-pay | Admitting: Registered Nurse

## 2021-09-04 DIAGNOSIS — R41840 Attention and concentration deficit: Secondary | ICD-10-CM

## 2021-10-10 ENCOUNTER — Other Ambulatory Visit: Payer: Self-pay | Admitting: Registered Nurse

## 2021-10-10 DIAGNOSIS — J4540 Moderate persistent asthma, uncomplicated: Secondary | ICD-10-CM

## 2021-10-26 ENCOUNTER — Other Ambulatory Visit: Payer: Self-pay

## 2021-10-26 ENCOUNTER — Encounter: Payer: Self-pay | Admitting: Registered Nurse

## 2021-10-26 ENCOUNTER — Telehealth (INDEPENDENT_AMBULATORY_CARE_PROVIDER_SITE_OTHER): Payer: BC Managed Care – PPO | Admitting: Registered Nurse

## 2021-10-26 ENCOUNTER — Telehealth: Payer: BC Managed Care – PPO | Admitting: Registered Nurse

## 2021-10-26 DIAGNOSIS — R41840 Attention and concentration deficit: Secondary | ICD-10-CM | POA: Diagnosis not present

## 2021-10-26 DIAGNOSIS — J4540 Moderate persistent asthma, uncomplicated: Secondary | ICD-10-CM

## 2021-10-26 DIAGNOSIS — J4541 Moderate persistent asthma with (acute) exacerbation: Secondary | ICD-10-CM | POA: Diagnosis not present

## 2021-10-26 MED ORDER — LISDEXAMFETAMINE DIMESYLATE 50 MG PO CAPS: 50.0000 mg | ORAL_CAPSULE | Freq: Every day | ORAL | 0 refills | Status: DC

## 2021-10-26 MED ORDER — PREDNISONE 10 MG (21) PO TBPK: ORAL_TABLET | ORAL | 0 refills | Status: DC

## 2021-10-26 MED ORDER — FLUTICASONE PROPIONATE HFA 220 MCG/ACT IN AERO
2.0000 | INHALATION_SPRAY | Freq: Every day | RESPIRATORY_TRACT | 4 refills | Status: DC
Start: 2021-10-26 — End: 2022-05-15

## 2021-10-26 MED ORDER — ALBUTEROL SULFATE HFA 108 (90 BASE) MCG/ACT IN AERS: 1.0000 | INHALATION_SPRAY | RESPIRATORY_TRACT | 11 refills | Status: DC | PRN

## 2021-10-26 NOTE — Patient Instructions (Signed)
° ° ° °  If you have lab work done today you will be contacted with your lab results within the next 2 weeks.  If you have not heard from us then please contact us. The fastest way to get your results is to register for My Chart. ° ° °IF you received an x-ray today, you will receive an invoice from [facility]. Please contact [facility] at [phone] with questions or concerns regarding your invoice.  ° °IF you received labwork today, you will receive an invoice from [facility]. Please contact [facility] at [phone] with questions or concerns regarding your invoice.  ° °Our billing staff will not be able to assist you with questions regarding bills from these companies. ° °You will be contacted with the lab results as soon as they are available. The fastest way to get your results is to activate your My Chart account. Instructions are located on the last page of this paperwork. If you have not heard from us regarding the results in 2 weeks, please contact this office. °  ° ° ° °

## 2021-10-26 NOTE — Progress Notes (Signed)
? ? ?Telemedicine Encounter- SOAP NOTE Established Patient ? ?This telephone encounter was conducted with the patient's (or proxy's) verbal consent via audio telecommunications: yes/no: Yes ?Patient was instructed to have this encounter in a suitably private space; and to only have persons present to whom they give permission to participate. In addition, patient identity was confirmed by use of name plus two identifiers (DOB and address).  I discussed the limitations, risks, security and privacy concerns of performing an evaluation and management service by telephone and the availability of in person appointments. I also discussed with the patient that there may be a patient responsible charge related to this service. The patient expressed understanding and agreed to proceed. ? ?I spent a total of 14 minutes talking with the patient or their proxy. ? ?Patient at home ?Provider in office ? ?Participants: Jari Sportsman, NP and Jaycelyn Dahms ? ?Chief Complaint  ?Patient presents with  ? Medication Refill  ?  Patient states she needs a medication refill on her inhaler. Patient states she used another in haler from a friend that made her feel better because she had to take 80 puffs of hers  ? ? ?Subjective  ? ?Sheryl Jackson is a 21 y.o. established patient. Telephone visit today for medication management ? ?HPI ?Concentration deficit ?Using Vyvanse 50mg  po qd ?Good effect, no AE ?Hopes to continue. ? ?Anxiety, depression, insomnia ?Takes mirtazapine 7.5mg  po qhs ?Good effect, no AE. Hopes to continue.  ? ?Moderate Persistent Extrinsic Asthma ?Had used the as a daily inhaler in the past. However, has been out of this for some time. ?As a result, has been using albuterol much more, more than rx.  ?Has used a friend's symbicort with good effect, no AE ?Feels she needs to get back on a daily inhaler ?No productive cough, LOC, headaches, nail clubbing, nvd ? ?Patient Active Problem List  ? Diagnosis Date  Noted  ? Concentration deficit 10/26/2021  ? 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 Intractable cyclical vomiting with nausea 05/28/2017  ? Marijuana abuse 05/28/2017  ? Seasonal allergic rhinitis due to pollen 05/28/2017  ? Intermittent vomiting 01/13/2017  ? MDD (major depressive disorder), recurrent episode, severe (HCC) 07/31/2016  ? Anemia 04/17/2016  ? GAD (generalized anxiety disorder) 03/15/2016  ? Anaphylaxis 02/28/2016  ? Angioedema 02/28/2016  ? Urticaria 02/28/2016  ? Acne vulgaris 07/06/2015  ? Metrorrhagia 07/06/2015  ? ? ?Past Medical History:  ?Diagnosis Date  ? Acne   ? Allergy   ? Anxiety   ? Depression   ? Environmental allergies   ? History of recurrent UTIs   ? Hives   ? Innocent heart murmur   ? ? ?Current Outpatient Medications  ?Medication Sig Dispense Refill  ? fluticasone (FLOVENT HFA) 220 MCG/ACT inhaler Inhale 2 puffs into the lungs daily. 3 each 4  ? mirtazapine (REMERON) 7.5 MG tablet Take 1 tablet (7.5 mg total) by mouth at bedtime. 90 tablet 3  ? predniSONE (STERAPRED UNI-PAK 21 TAB) 10 MG (21) TBPK tablet Take per package instructions. Do not skip doses. Finish entire supply. 1 each 0  ? albuterol (VENTOLIN HFA) 108 (90 Base) MCG/ACT inhaler Inhale 1-2 puffs into the lungs every 4 (four) hours as needed for wheezing or shortness of breath. 18 g 11  ? [START ON 12/01/2021] lisdexamfetamine (VYVANSE) 50 MG capsule Take 1 capsule (50 mg total) by mouth daily. 90 capsule 0  ? ?No current facility-administered medications for this visit.  ? ? ?Allergies  ?  Allergen Reactions  ? Other Hives and Anaphylaxis  ?  Carries Epi-Pen for when she gets hives. Source of hives is undetermined despite testing. ?Ragweed  ? ? ?Social History  ? ?Socioeconomic History  ? Marital status: Single  ?  Spouse name: Not on file  ? Number of children: 0  ? Years of education: Not on file  ? Highest education level: Not on file  ?Occupational History  ? Not on file  ?Tobacco Use  ? Smoking status:  Former  ?  Types: E-cigarettes  ? Smokeless tobacco: Never  ?Vaping Use  ? Vaping Use: Former  ?Substance and Sexual Activity  ? Alcohol use: Yes  ?  Comment: Every now and then  ? Drug use: Yes  ?  Types: Marijuana  ?  Comment: none in several weeks  ? Sexual activity: Yes  ?  Birth control/protection: Condom  ?Other Topics Concern  ? Not on file  ?Social History Narrative  ? Not on file  ? ?Social Determinants of Health  ? ?Financial Resource Strain: Not on file  ?Food Insecurity: Not on file  ?Transportation Needs: Not on file  ?Physical Activity: Not on file  ?Stress: Not on file  ?Social Connections: Not on file  ?Intimate Partner Violence: Not on file  ? ? ?ROS ?Per hpi  ? ?Objective  ? ?Vitals as reported by the patient: ?There were no vitals filed for this visit. ? ?Sheryl Jackson was seen today for medication refill. ? ?Diagnoses and all orders for this visit: ? ?Concentration deficit ?-     lisdexamfetamine (VYVANSE) 50 MG capsule; Take 1 capsule (50 mg total) by mouth daily. ? ?Moderate persistent extrinsic asthma without complication ?-     albuterol (VENTOLIN HFA) 108 (90 Base) MCG/ACT inhaler; Inhale 1-2 puffs into the lungs every 4 (four) hours as needed for wheezing or shortness of breath. ?-     fluticasone (FLOVENT HFA) 220 MCG/ACT inhaler; Inhale 2 puffs into the lungs daily. ? ?Moderate persistent asthma with exacerbation ?-     predniSONE (STERAPRED UNI-PAK 21 TAB) 10 MG (21) TBPK tablet; Take per package instructions. Do not skip doses. Finish entire supply. ? ? ? ?PLAN ?Refill vyvanse when appropriate ?Start flovent 2 puffs daily. Continue albuterol as rescue inhaler ?Prednisone taper for acute symptoms. ER precautions given ?Patient encouraged to call clinic with any questions, comments, or concerns. ? ?I discussed the assessment and treatment plan with the patient. The patient was provided an opportunity to ask questions and all were answered. The patient agreed with the plan and demonstrated an  understanding of the instructions. ?  ?The patient was advised to call back or seek an in-person evaluation if the symptoms worsen or if the condition fails to improve as anticipated. ? ?I provided 14 minutes of non-face-to-face time during this encounter. ? ?[name], NP ? ?

## 2021-10-30 ENCOUNTER — Encounter: Payer: Self-pay | Admitting: Registered Nurse

## 2021-11-02 ENCOUNTER — Encounter: Payer: Self-pay | Admitting: Registered Nurse

## 2021-11-25 ENCOUNTER — Encounter: Payer: Self-pay | Admitting: Registered Nurse

## 2021-11-28 ENCOUNTER — Other Ambulatory Visit: Payer: Self-pay | Admitting: Registered Nurse

## 2021-11-28 DIAGNOSIS — R41840 Attention and concentration deficit: Secondary | ICD-10-CM

## 2021-11-28 MED ORDER — LISDEXAMFETAMINE DIMESYLATE 50 MG PO CAPS: 50.0000 mg | ORAL_CAPSULE | Freq: Every day | ORAL | 0 refills | Status: DC

## 2021-12-08 DIAGNOSIS — M9903 Segmental and somatic dysfunction of lumbar region: Secondary | ICD-10-CM | POA: Diagnosis not present

## 2021-12-08 DIAGNOSIS — M9904 Segmental and somatic dysfunction of sacral region: Secondary | ICD-10-CM | POA: Diagnosis not present

## 2021-12-15 ENCOUNTER — Encounter: Payer: Self-pay | Admitting: Registered Nurse

## 2022-02-16 DIAGNOSIS — E559 Vitamin D deficiency, unspecified: Secondary | ICD-10-CM | POA: Diagnosis not present

## 2022-02-16 DIAGNOSIS — Z0000 Encounter for general adult medical examination without abnormal findings: Secondary | ICD-10-CM | POA: Diagnosis not present

## 2022-02-16 DIAGNOSIS — E611 Iron deficiency: Secondary | ICD-10-CM | POA: Diagnosis not present

## 2022-02-16 DIAGNOSIS — S060X1D Concussion with loss of consciousness of 30 minutes or less, subsequent encounter: Secondary | ICD-10-CM | POA: Diagnosis not present

## 2022-02-16 DIAGNOSIS — F419 Anxiety disorder, unspecified: Secondary | ICD-10-CM | POA: Diagnosis not present

## 2022-02-16 DIAGNOSIS — Z13220 Encounter for screening for lipoid disorders: Secondary | ICD-10-CM | POA: Diagnosis not present

## 2022-02-16 DIAGNOSIS — Z131 Encounter for screening for diabetes mellitus: Secondary | ICD-10-CM | POA: Diagnosis not present

## 2022-02-27 ENCOUNTER — Telehealth: Payer: Self-pay | Admitting: Registered Nurse

## 2022-02-27 NOTE — Telephone Encounter (Signed)
Pt called in asking for a new script of the Vyvanse of the [facility] [address]  Please advise

## 2022-02-28 ENCOUNTER — Other Ambulatory Visit: Payer: Self-pay | Admitting: Family

## 2022-02-28 DIAGNOSIS — R41840 Attention and concentration deficit: Secondary | ICD-10-CM

## 2022-02-28 MED ORDER — LISDEXAMFETAMINE DIMESYLATE 50 MG PO CAPS: 50.0000 mg | ORAL_CAPSULE | Freq: Every day | ORAL | 0 refills | Status: DC

## 2022-03-21 ENCOUNTER — Other Ambulatory Visit: Payer: Self-pay | Admitting: Registered Nurse

## 2022-03-21 DIAGNOSIS — R41840 Attention and concentration deficit: Secondary | ICD-10-CM

## 2022-03-21 NOTE — Telephone Encounter (Signed)
Encourage patient to contact the pharmacy for refills or they can request refills through [facility]  (Please schedule appointment if patient has not been seen in over a year)    WHAT PHARMACY WOULD THEY LIKE THIS SENT TO:  [facility] [phone]  MEDICATION NAME & DOSE:  vyvance 50 mg  NOTES/COMMENTS FROM PATIENT: has an appointment with [facility] 10/23 to establish new pcp      Front office please notify patient: It takes 48-72 hours to process rx refill requests Ask patient to call pharmacy to ensure rx is ready before heading there.

## 2022-03-21 NOTE — Telephone Encounter (Signed)
Patient is requesting a refill of the following medications: Requested Prescriptions   Pending Prescriptions Disp Refills   lisdexamfetamine (VYVANSE) 50 MG capsule 90 capsule 0    Sig: Take 1 capsule (50 mg total) by mouth daily.    Date of patient request: 03/21/22 Last office visit: 10/26/21 Date of last refill: 02/28/22 Last refill amount: 30 Follow up time period per chart: 6 months  Called [facility] to verify as quantity was written for 90 but only 30 was approved and dispensed

## 2022-03-22 MED ORDER — LISDEXAMFETAMINE DIMESYLATE 50 MG PO CAPS: 50.0000 mg | ORAL_CAPSULE | Freq: Every day | ORAL | 0 refills | Status: DC

## 2022-03-22 NOTE — Telephone Encounter (Signed)
Chart reviewed, last visit with PCP May 4.  Continued on Vyvanse 50 mg daily at that time.  Appointment pending with Dr. Cherlynn Kaiser on 04/16/2022.  Controlled substance database reviewed.  Vyvanse 50 mg #30 filled on 02/28/2022.  Refill ordered to cover until upcoming appointment with new PCP.

## 2022-04-16 ENCOUNTER — Encounter: Payer: BC Managed Care – PPO | Admitting: Family Medicine

## 2022-04-24 ENCOUNTER — Other Ambulatory Visit: Payer: Self-pay

## 2022-04-24 DIAGNOSIS — R41840 Attention and concentration deficit: Secondary | ICD-10-CM

## 2022-04-24 MED ORDER — LISDEXAMFETAMINE DIMESYLATE 50 MG PO CAPS: 50.0000 mg | ORAL_CAPSULE | Freq: Every day | ORAL | 0 refills | Status: DC

## 2022-04-24 NOTE — Telephone Encounter (Signed)
Has appt to see new PCP had to cancel last week due to COVID needs one more fill until she can be seen  Patient is requesting a refill of the following medications: Requested Prescriptions   Pending Prescriptions Disp Refills   lisdexamfetamine (VYVANSE) 50 MG capsule 30 capsule 0    Sig: Take 1 capsule (50 mg total) by mouth daily.    Date of patient request: 04/24/22 Last office visit: 10/26/21 Date of last refill: 03/22/22 Last refill amount: 30

## 2022-04-27 DIAGNOSIS — J020 Streptococcal pharyngitis: Secondary | ICD-10-CM | POA: Diagnosis not present

## 2022-04-27 DIAGNOSIS — T3695XA Adverse effect of unspecified systemic antibiotic, initial encounter: Secondary | ICD-10-CM | POA: Diagnosis not present

## 2022-04-27 DIAGNOSIS — B379 Candidiasis, unspecified: Secondary | ICD-10-CM | POA: Diagnosis not present

## 2022-05-04 DIAGNOSIS — L509 Urticaria, unspecified: Secondary | ICD-10-CM | POA: Diagnosis not present

## 2022-05-04 DIAGNOSIS — J020 Streptococcal pharyngitis: Secondary | ICD-10-CM | POA: Diagnosis not present

## 2022-05-04 DIAGNOSIS — Z1331 Encounter for screening for depression: Secondary | ICD-10-CM | POA: Diagnosis not present

## 2022-05-15 ENCOUNTER — Encounter: Payer: Self-pay | Admitting: Family Medicine

## 2022-05-15 ENCOUNTER — Ambulatory Visit: Payer: BC Managed Care – PPO | Admitting: Family Medicine

## 2022-05-15 ENCOUNTER — Encounter: Payer: Self-pay | Admitting: Plastic Surgery

## 2022-05-15 ENCOUNTER — Ambulatory Visit (INDEPENDENT_AMBULATORY_CARE_PROVIDER_SITE_OTHER): Payer: Self-pay | Admitting: Plastic Surgery

## 2022-05-15 VITALS — BP 120/80 | HR 89

## 2022-05-15 VITALS — BP 116/73 | HR 76 | Temp 98.3°F | Ht 66.0 in | Wt 120.4 lb

## 2022-05-15 DIAGNOSIS — M542 Cervicalgia: Secondary | ICD-10-CM

## 2022-05-15 DIAGNOSIS — Z719 Counseling, unspecified: Secondary | ICD-10-CM | POA: Insufficient documentation

## 2022-05-15 DIAGNOSIS — F902 Attention-deficit hyperactivity disorder, combined type: Secondary | ICD-10-CM

## 2022-05-15 DIAGNOSIS — J4540 Moderate persistent asthma, uncomplicated: Secondary | ICD-10-CM

## 2022-05-15 DIAGNOSIS — J45909 Unspecified asthma, uncomplicated: Secondary | ICD-10-CM | POA: Insufficient documentation

## 2022-05-15 MED ORDER — ARNUITY ELLIPTA 200 MCG/ACT IN AEPB: 1.0000 | INHALATION_SPRAY | Freq: Every day | RESPIRATORY_TRACT | 3 refills | Status: DC

## 2022-05-15 MED ORDER — LISDEXAMFETAMINE DIMESYLATE 50 MG PO CAPS: 50.0000 mg | ORAL_CAPSULE | Freq: Every day | ORAL | 0 refills | Status: DC

## 2022-05-15 NOTE — Progress Notes (Signed)
Subjective:     Patient ID: Sheryl Jackson, female    DOB: 03-12-2001, 21 y.o.   MRN: [id number]  Chief Complaint  Patient presents with   Transitions Of Care    No questions or concerns. Pt needing vyvanse     HPI  Asthma-daily flovent-sens to air quality-ragweed and more Fall    albuterol about 2x/wk seasonally.  Ins won't cover so can do arnuity  2.  Insomnia-only if takes vyvanse too late, takes remeron 1/2 tab. 3.  ADHD-vyvanse 50mg  since college.  Was able to cope prior to college, but grades weren't great and symptoms whole life.  Adderall-the come downs were bad and "no personality".   No SI. 4.  Intermittent upper back/neck pain.  She has been to the chiropractor in the past but not Several years.  Health Maintenance Due  Topic Date Due   COVID-19 Vaccine (2 - 2023-24 season) 02/23/2022    Past Medical History:  Diagnosis Date   Acne    ADHD    Allergy    Anxiety    Depression    Environmental allergies    History of recurrent UTIs    Hives    Innocent heart murmur     Past Surgical History:  Procedure Laterality Date   WISDOM TOOTH EXTRACTION  2020    Outpatient Medications Prior to Visit  Medication Sig Dispense Refill   albuterol (VENTOLIN HFA) 108 (90 Base) MCG/ACT inhaler Inhale 1-2 puffs into the lungs every 4 (four) hours as needed for wheezing or shortness of breath. 18 g 11   levalbuterol (XOPENEX HFA) 45 MCG/ACT inhaler SMARTSIG:2 Puff(s) By Mouth Every 6 Hours     mirtazapine (REMERON) 7.5 MG tablet Take 1 tablet (7.5 mg total) by mouth at bedtime. 90 tablet 3   fluticasone (FLOVENT HFA) 220 MCG/ACT inhaler Inhale 2 puffs into the lungs daily. 3 each 4   lisdexamfetamine (VYVANSE) 50 MG capsule Take 1 capsule (50 mg total) by mouth daily. 30 capsule 0   No facility-administered medications prior to visit.    Allergies  Allergen Reactions   Other Hives and Anaphylaxis    Carries Epi-Pen for when she gets hives. Source of hives is  undetermined despite testing. Ragweed   ROS neg/noncontributory except as noted HPI/below      Objective:     BP 116/73 (BP Location: Right Arm, Patient Position: Sitting)   Pulse 76   Temp 98.3 F (36.8 C) (Temporal)   Ht 5\' 6"  (1.676 m)   Wt 120 lb 6.4 oz (54.6 kg)   LMP 05/14/2022 (Exact Date)   SpO2 98%   BMI 19.43 kg/m  Wt Readings from Last 3 Encounters:  05/15/22 120 lb 6.4 oz (54.6 kg)  04/06/21 110 lb (49.9 kg) (17 %, Z= -0.97)*  02/06/21 111 lb 12.4 oz (50.7 kg) (20 %, Z= -0.83)*   * Growth percentiles are based on CDC (Girls, 2-20 Years) data.    Physical Exam   Gen: WDWN NAD HEENT: NCAT, conjunctiva not injected, sclera nonicteric NECK:  supple, no thyromegaly, no nodes, no carotid bruits CARDIAC: RRR, S1S2+, no murmur. DP 2+B LUNGS: CTAB. No wheezes ABDOMEN:  BS+, soft, NTND, No HSM, no masses EXT:  no edema MSK: no gross abnormalities.  Positive muscle tightness/tenderness upper back/neck NEURO: A&O x3.  CN II-XII intact.  PSYCH: normal mood. Good eye contact      Assessment & Plan:   Problem List Items Addressed This Visit  Respiratory   Extrinsic asthma   Relevant Medications   Fluticasone Furoate (ARNUITY ELLIPTA) 200 MCG/ACT AEPB     Other   Attention deficit hyperactivity disorder (ADHD), combined type - Primary   Relevant Orders   DRUG MONITORING, PANEL 8 WITH CONFIRMATION, URINE   Other Visit Diagnoses     Cervicalgia         1.  ADHD-chronic.  Well-controlled on Vyvanse 50 mg daily.  Renewed meds for 3 months.  PDMP checked.  Will check UDS.  Advised to stop smoking marijuana.  Follow-up video visit in 3 months 2.  Moderate persistent asthma-chronic.  Was controlled on Flovent, however insurance prefers Arnuity.  Will change to Arnuity Ellipta 200 mcg 1 puff daily.  Continue albuterol 2 puffs every 6 hours as needed 3.  Cervicalgia-patient spends a lot of time studying and on the computer.  Advised to consider chiropractor.   Massage therapy.  Do stretches (demoed).  Meds ordered this encounter  Medications   Fluticasone Furoate (ARNUITY ELLIPTA) 200 MCG/ACT AEPB    Sig: Inhale 1 puff into the lungs daily in the afternoon.    Dispense:  90 each    Refill:  3   lisdexamfetamine (VYVANSE) 50 MG capsule    Sig: Take 1 capsule (50 mg total) by mouth daily before breakfast.    Dispense:  30 capsule    Refill:  0   lisdexamfetamine (VYVANSE) 50 MG capsule    Sig: Take 1 capsule (50 mg total) by mouth daily before breakfast.    Dispense:  30 capsule    Refill:  0   lisdexamfetamine (VYVANSE) 50 MG capsule    Sig: Take 1 capsule (50 mg total) by mouth daily before breakfast.    Dispense:  30 capsule    Refill:  0    Angelena Sole, MD

## 2022-05-15 NOTE — Progress Notes (Signed)
The patient is a 21 year old female here for further evaluation of her face.  She had filler placed in her nose and upper lip.  This was done in March and the patient did not utilize the entire vial.  She came back today thinking that she was going to use the rest of it.  I am very concerned about the risk of infection and do not agree with having that in the refrigerator for that long with 3 use.  I explained this to the patient and they were very understanding.  They are not ready to purchase more filler today.  She also asked about laser for her face and hands.  I will be happy to do that for her few times to see if we get any improvement without cost to make up for what she feels she lost with the extra filler.  We will get her scheduled for that.  I also gave her the name of the laser so she can look it up online and see the benefits.

## 2022-05-15 NOTE — Patient Instructions (Signed)
It was very nice to see you today!  Happy Holidays!   PLEASE NOTE:  If you had any lab tests please let us know if you have not heard back within a few days. You may see your results on MyChart before we have a chance to review them but we will give you a call once they are reviewed by us. If we ordered any referrals today, please let us know if you have not heard from their office within the next week.   Please try these tips to maintain a healthy lifestyle:  Eat most of your calories during the day when you are active. Eliminate processed foods including packaged sweets (pies, cakes, cookies), reduce intake of potatoes, white bread, white pasta, and white rice. Look for whole grain options, oat flour or almond flour.  Each meal should contain half fruits/vegetables, one quarter protein, and one quarter carbs (no bigger than a computer mouse).  Cut down on sweet beverages. This includes juice, soda, and sweet tea. Also watch fruit intake, though this is a healthier sweet option, it still contains natural sugar! Limit to 3 servings daily.  Drink at least 1 glass of water with each meal and aim for at least 8 glasses per day  Exercise at least 150 minutes every week.   

## 2022-05-18 LAB — DRUG MONITORING, PANEL 8 WITH CONFIRMATION, URINE
6 Acetylmorphine: NEGATIVE ng/mL (ref <10)
Alcohol Metabolites: POSITIVE ng/mL — AB (ref <500)
Amphetamine: 2875 ng/mL — ABNORMAL HIGH (ref <250)
Amphetamines: POSITIVE ng/mL — AB (ref <500)
Benzodiazepines: NEGATIVE ng/mL (ref <100)
Buprenorphine, Urine: NEGATIVE ng/mL (ref <5)
Cocaine Metabolite: NEGATIVE ng/mL (ref <150)
Creatinine: 33.6 mg/dL (ref >=20.0)
Ethyl Glucuronide (ETG): 893 ng/mL — ABNORMAL HIGH (ref <500)
Ethyl Sulfate (ETS): 177 ng/mL — ABNORMAL HIGH (ref <100)
MDMA: NEGATIVE ng/mL (ref <500)
Marijuana Metabolite: 1065 ng/mL — ABNORMAL HIGH (ref <5)
Marijuana Metabolite: POSITIVE ng/mL — AB (ref <20)
Methamphetamine: NEGATIVE ng/mL (ref <250)
Opiates: NEGATIVE ng/mL (ref <100)
Oxidant: NEGATIVE ug/mL (ref <200)
Oxycodone: NEGATIVE ng/mL (ref <100)
pH: 7.4 (ref 4.5–9.0)

## 2022-05-18 LAB — DM TEMPLATE

## 2022-06-19 ENCOUNTER — Encounter: Payer: Self-pay | Admitting: Plastic Surgery

## 2022-06-19 ENCOUNTER — Ambulatory Visit (INDEPENDENT_AMBULATORY_CARE_PROVIDER_SITE_OTHER): Payer: BC Managed Care – PPO | Admitting: Plastic Surgery

## 2022-06-19 DIAGNOSIS — Z719 Counseling, unspecified: Secondary | ICD-10-CM

## 2022-06-19 NOTE — Progress Notes (Signed)
Preoperative Dx: pigment on right hand from dog bite  Postoperative Dx:  same  Procedure: laser to right hand   Anesthesia: none  Description of Procedure:  Risks and complications were explained to the patient. Consent was confirmed and signed. Eye protection was placed. Time out was called and all information was confirmed to be correct. The area  area was prepped with alcohol and wiped dry. The BBL laser was set at 515 nm and 12 J/cm2. The hand was lasered. The patient tolerated the procedure well and there were no complications. The patient is to follow up in 4 weeks.

## 2022-07-20 ENCOUNTER — Ambulatory Visit (INDEPENDENT_AMBULATORY_CARE_PROVIDER_SITE_OTHER): Payer: Self-pay | Admitting: Surgical

## 2022-07-20 DIAGNOSIS — Z411 Encounter for cosmetic surgery: Secondary | ICD-10-CM

## 2022-07-20 DIAGNOSIS — S0993XD Unspecified injury of face, subsequent encounter: Secondary | ICD-10-CM

## 2022-07-20 DIAGNOSIS — Z719 Counseling, unspecified: Secondary | ICD-10-CM

## 2022-07-20 NOTE — Progress Notes (Signed)
Preoperative Dx: Hyperpigmentation to right hand from dog bite, scar to left face from dog bite  Postoperative Dx:  same  Procedure: laser to face and right hand  Anesthesia: none  Description of Procedure:  Risks and complications were explained to the patient. Consent was confirmed and signed. Time out was called and all information was confirmed to be correct. The area  area was prepped with alcohol and wiped dry. The BBL 515 nm laser was set at 14 J/cm, the face and hand were lasered.  The patient tolerated the procedure well, there were no complications.   Patient does have significant rosacea as well as some acne, we discussed the BBL laser would be beneficial for this, we also discussed ZO Rozatrol would be beneficial as well.  We also discussed BBL laser for acne.  Patient had questions about if this could be included in her treatment for her scarring, I discussed with the patient that I was not sure if that was possible but we can plan to discuss that with Dr. Marla Roe.

## 2022-07-24 ENCOUNTER — Encounter: Payer: Self-pay | Admitting: Family Medicine

## 2022-07-24 ENCOUNTER — Telehealth (INDEPENDENT_AMBULATORY_CARE_PROVIDER_SITE_OTHER): Payer: BC Managed Care – PPO | Admitting: Family Medicine

## 2022-07-24 DIAGNOSIS — J4520 Mild intermittent asthma, uncomplicated: Secondary | ICD-10-CM

## 2022-07-24 DIAGNOSIS — F902 Attention-deficit hyperactivity disorder, combined type: Secondary | ICD-10-CM

## 2022-07-24 DIAGNOSIS — J4540 Moderate persistent asthma, uncomplicated: Secondary | ICD-10-CM | POA: Diagnosis not present

## 2022-07-24 MED ORDER — LISDEXAMFETAMINE DIMESYLATE 50 MG PO CHEW: 50.0000 mg | CHEWABLE_TABLET | Freq: Every day | ORAL | 0 refills | Status: DC

## 2022-07-24 MED ORDER — ALBUTEROL SULFATE HFA 108 (90 BASE) MCG/ACT IN AERS: 1.0000 | INHALATION_SPRAY | RESPIRATORY_TRACT | 1 refills | Status: DC | PRN

## 2022-07-24 NOTE — Patient Instructions (Signed)
It was very nice to see you today!  Let me know if problems getting the chewables   PLEASE NOTE:  If you had any lab tests please let us know if you have not heard back within a few days. You may see your results on MyChart before we have a chance to review them but we will give you a call once they are reviewed by [place]. If we ordered any referrals today, please let us know if you have not heard from their office within the next week.   Please try these tips to maintain a healthy lifestyle:  Eat most of your calories during the day when you are active. Eliminate processed foods including packaged sweets (pies, cakes, cookies), reduce intake of potatoes, white bread, white pasta, and white rice. Look for whole grain options, oat flour or almond flour.  Each meal should contain half fruits/vegetables, one quarter protein, and one quarter carbs (no bigger than a computer mouse).  Cut down on sweet beverages. This includes juice, soda, and sweet tea. Also watch fruit intake, though this is a healthier sweet option, it still contains natural sugar! Limit to 3 servings daily.  Drink at least 1 glass of water with each meal and aim for at least 8 glasses per day  Exercise at least 150 minutes every week.

## 2022-07-24 NOTE — Progress Notes (Signed)
MyChart Video Visit    Virtual Visit via Video Note   This visit type was conducted due to national recommendations for restrictions regarding the COVID-19 Pandemic (e.g. social distancing) in an effort to limit this patient's exposure and mitigate transmission in our community. This patient is at least at moderate risk for complications without adequate follow up. This format is felt to be most appropriate for this patient at this time. Physical exam was limited by quality of the video and audio technology used for the visit. CMA was able to get the patient set up on a video visit.  Patient location: Home. Patient and provider in visit Provider location: Office  I discussed the limitations of evaluation and management by telemedicine and the availability of in person appointments. The patient expressed understanding and agreed to proceed.  Visit Date: 07/24/2022  Today's healthcare provider: Wellington Hampshire, MD     Subjective:    Patient ID: Sheryl Jackson, female    DOB: [birth year], 22 y.o.   MRN: [id number]  Chief Complaint  Patient presents with   ADHD    HPI  ADHD-doing well on vyvanse 50mg .  Having trouble getting it.  Told needs generic.  Can get the chewable generic. No SI.eating well.  Not losing wt.  Asthma-doing well on arnuity.  Occ albuterol-more environmental. Needs refill  Past Medical History:  Diagnosis Date   Acne    ADHD    Allergy    Anxiety    Depression    Environmental allergies    History of recurrent UTIs    Hives    Innocent heart murmur     Past Surgical History:  Procedure Laterality Date   WISDOM TOOTH EXTRACTION  2020    Outpatient Medications Prior to Visit  Medication Sig Dispense Refill   albuterol (VENTOLIN HFA) 108 (90 Base) MCG/ACT inhaler Inhale 1-2 puffs into the lungs every 4 (four) hours as needed for wheezing or shortness of breath. 18 g 11   Fluticasone Furoate (ARNUITY ELLIPTA) 200 MCG/ACT AEPB Inhale 1 puff into the  lungs daily in the afternoon. 90 each 3   lisdexamfetamine (VYVANSE) 50 MG capsule Take 1 capsule (50 mg total) by mouth daily before breakfast. 30 capsule 0   mirtazapine (REMERON) 7.5 MG tablet Take 1 tablet (7.5 mg total) by mouth at bedtime. 90 tablet 3   levalbuterol (XOPENEX HFA) 45 MCG/ACT inhaler SMARTSIG:2 Puff(s) By Mouth Every 6 Hours     lisdexamfetamine (VYVANSE) 50 MG capsule Take 1 capsule (50 mg total) by mouth daily before breakfast. 30 capsule 0   lisdexamfetamine (VYVANSE) 50 MG capsule Take 1 capsule (50 mg total) by mouth daily before breakfast. 30 capsule 0   No facility-administered medications prior to visit.    Allergies  Allergen Reactions   Other Hives and Anaphylaxis    Carries Epi-Pen for when she gets hives. Source of hives is undetermined despite testing. Ragweed   Amoxicillin         Objective:     Physical Exam  Vitals and nursing note reviewed.  Constitutional:      General:  is not in acute distress.    Appearance: Normal appearance.  HENT:     Head: Normocephalic.  Pulmonary:     Effort: No respiratory distress.  Skin:    General: Skin is dry.     Coloration: Skin is not pale.  Neurological:     Mental Status: Pt is alert and oriented to person, place, and  time.  Psychiatric:        Mood and Affect: Mood normal.   LMP 07/24/2022   Wt Readings from Last 3 Encounters:  05/15/22 120 lb 6.4 oz (54.6 kg)  04/06/21 110 lb (49.9 kg) (17 %, Z= -0.97)*  02/06/21 111 lb 12.4 oz (50.7 kg) (20 %, Z= -0.83)*   * Growth percentiles are based on CDC (Girls, 2-20 Years) data.       Assessment & Plan:   Problem List Items Addressed This Visit       Respiratory   Extrinsic asthma - Primary     Other   Attention deficit hyperactivity disorder (ADHD), combined type   Asthma-chronic. well controlled on arnuity daily.  Needs refill on albuterol ADHD-chronic. Controlled on vyvanse 50mg .  Pdmp checked.  Having some trouble getting so would  like to try the chewables.  3 rx's sent.  F/u 3 mo.   No orders of the defined types were placed in this encounter.   I discussed the assessment and treatment plan with the patient. The patient was provided an opportunity to ask questions and all were answered. The patient agreed with the plan and demonstrated an understanding of the instructions.   The patient was advised to call back or seek an in-person evaluation if the symptoms worsen or if the condition fails to improve as anticipated.  I provided 15 minutes of face-to-face time during this encounter.   Wellington Hampshire, MD [place] [phone] (phone) [phone] (fax)  [place]

## 2022-08-17 ENCOUNTER — Encounter: Payer: Self-pay | Admitting: Surgical

## 2022-08-17 ENCOUNTER — Ambulatory Visit (INDEPENDENT_AMBULATORY_CARE_PROVIDER_SITE_OTHER): Payer: Self-pay | Admitting: Surgical

## 2022-08-17 VITALS — BP 124/76 | HR 83

## 2022-08-17 DIAGNOSIS — Z411 Encounter for cosmetic surgery: Secondary | ICD-10-CM

## 2022-08-17 NOTE — Progress Notes (Signed)
Preoperative Dx: hyperpigmentation of right hand scar and left face scar  Postoperative Dx:  same  Procedure: laser to face and right hand  Anesthesia: none  Description of Procedure:  Risks and complications were explained to the patient. Consent was confirmed. Time out was called and all information was confirmed to be correct.   The area was prepped with alcohol wipe dry, the BBL 515 nm laser was set at 15 J/cm, the face and hand were lasered with 1 pass over each area of hypopigmentation.  The patient tolerated the procedure well.  There were no complications.

## 2022-09-08 DIAGNOSIS — H00014 Hordeolum externum left upper eyelid: Secondary | ICD-10-CM | POA: Diagnosis not present

## 2022-09-08 DIAGNOSIS — H00016 Hordeolum externum left eye, unspecified eyelid: Secondary | ICD-10-CM | POA: Diagnosis not present

## 2022-09-29 ENCOUNTER — Encounter: Payer: Self-pay | Admitting: Family Medicine

## 2022-10-08 DIAGNOSIS — Z681 Body mass index (BMI) 19 or less, adult: Secondary | ICD-10-CM | POA: Diagnosis not present

## 2022-10-08 DIAGNOSIS — R053 Chronic cough: Secondary | ICD-10-CM | POA: Diagnosis not present

## 2022-11-16 ENCOUNTER — Ambulatory Visit (INDEPENDENT_AMBULATORY_CARE_PROVIDER_SITE_OTHER): Payer: Self-pay | Admitting: Surgical

## 2022-11-16 DIAGNOSIS — Z411 Encounter for cosmetic surgery: Secondary | ICD-10-CM

## 2022-11-16 DIAGNOSIS — S0993XD Unspecified injury of face, subsequent encounter: Secondary | ICD-10-CM

## 2022-11-16 NOTE — Progress Notes (Signed)
Preoperative Dx: hyperpigmentation to hand and face scar  Postoperative Dx:  same  Procedure: laser to face and hand   Anesthesia: none  Description of Procedure:  Risks and complications were explained to the patient. Consent was confirmed and signed. Time out was called and all information was confirmed to be correct. The area  area was prepped with alcohol and wiped dry. The 515 nm laser was set at 15 J/cm2. The hand was lasered. The 515nm laser was then set at 12 j/cm2 and the face was lasered. The patient tolerated the procedure well and there were no complications.  No f/u scheduled.

## 2022-11-21 ENCOUNTER — Other Ambulatory Visit: Payer: Self-pay | Admitting: Family Medicine

## 2022-11-21 ENCOUNTER — Encounter: Payer: Self-pay | Admitting: Family Medicine

## 2022-11-21 MED ORDER — LISDEXAMFETAMINE DIMESYLATE 50 MG PO CHEW: 50.0000 mg | CHEWABLE_TABLET | Freq: Every day | ORAL | 0 refills | Status: DC

## 2022-11-27 ENCOUNTER — Telehealth (INDEPENDENT_AMBULATORY_CARE_PROVIDER_SITE_OTHER): Payer: BC Managed Care – PPO | Admitting: Family Medicine

## 2022-11-27 ENCOUNTER — Encounter: Payer: Self-pay | Admitting: Family Medicine

## 2022-11-27 DIAGNOSIS — J4540 Moderate persistent asthma, uncomplicated: Secondary | ICD-10-CM

## 2022-11-27 DIAGNOSIS — N921 Excessive and frequent menstruation with irregular cycle: Secondary | ICD-10-CM | POA: Diagnosis not present

## 2022-11-27 DIAGNOSIS — J4530 Mild persistent asthma, uncomplicated: Secondary | ICD-10-CM | POA: Diagnosis not present

## 2022-11-27 DIAGNOSIS — F902 Attention-deficit hyperactivity disorder, combined type: Secondary | ICD-10-CM | POA: Diagnosis not present

## 2022-11-27 MED ORDER — ALBUTEROL SULFATE HFA 108 (90 BASE) MCG/ACT IN AERS: 1.0000 | INHALATION_SPRAY | RESPIRATORY_TRACT | 1 refills | Status: DC | PRN

## 2022-11-27 MED ORDER — LISDEXAMFETAMINE DIMESYLATE 50 MG PO CHEW
50.0000 mg | CHEWABLE_TABLET | Freq: Every day | ORAL | 0 refills | Status: AC
Start: 2022-12-27 — End: ?

## 2022-11-27 MED ORDER — LISDEXAMFETAMINE DIMESYLATE 50 MG PO CHEW: 50.0000 mg | CHEWABLE_TABLET | Freq: Every day | ORAL | 0 refills | Status: DC

## 2022-11-27 MED ORDER — NORELGESTROMIN-ETH ESTRADIOL 150-35 MCG/24HR TD PTWK
1.0000 | MEDICATED_PATCH | TRANSDERMAL | 4 refills | Status: DC
Start: 2022-11-27 — End: 2023-04-03

## 2022-11-27 MED ORDER — ARNUITY ELLIPTA 200 MCG/ACT IN AEPB: 1.0000 | INHALATION_SPRAY | Freq: Every day | RESPIRATORY_TRACT | 3 refills | Status: AC

## 2022-11-27 NOTE — Patient Instructions (Signed)
It was very nice to see you today!  Ideally start patch on your cycle-so now.     PLEASE NOTE:  If you had any lab tests please let us know if you have not heard back within a few days. You may see your results on MyChart before we have a chance to review them but we will give you a call once they are reviewed by [place]. If we ordered any referrals today, please let us know if you have not heard from their office within the next week.   Please try these tips to maintain a healthy lifestyle:  Eat most of your calories during the day when you are active. Eliminate processed foods including packaged sweets (pies, cakes, cookies), reduce intake of potatoes, white bread, white pasta, and white rice. Look for whole grain options, oat flour or almond flour.  Each meal should contain half fruits/vegetables, one quarter protein, and one quarter carbs (no bigger than a computer mouse).  Cut down on sweet beverages. This includes juice, soda, and sweet tea. Also watch fruit intake, though this is a healthier sweet option, it still contains natural sugar! Limit to 3 servings daily.  Drink at least 1 glass of water with each meal and aim for at least 8 glasses per day  Exercise at least 150 minutes every week.

## 2022-11-27 NOTE — Progress Notes (Signed)
MyChart Video Visit Virtual Visit via Video Note   This visit type was conducted w/patient consent. This format is felt to be most appropriate for this patient at this time. Physical exam was limited by quality of the video and audio technology used for the visit. CMA was able to get the patient set up on a video visit.  Patient location: Home. Patient and provider in visit Provider location: Office  I discussed the limitations of evaluation and management by telemedicine and the availability of in person appointments. The patient expressed understanding and agreed to proceed.  Visit Date: 11/27/2022  Today's healthcare provider: Angelena Sole, MD     Subjective:    Patient ID: Sheryl Jackson, female    DOB: [birth year], 22 y.o.   MRN: [id number]  No chief complaint on file.   HPI  ADHD-Vyvanse doing well.  No SI 2.  Asthma-using Arnuity daily.  Albuterol depends on exposures-dogs, perfume. Occasional uses if gets anxious/chest tight 3.  Getting periods twice/month.  Started last summer(went abroad to [place]).  Cramps, etc. Last menstrual period end of month(full moon).  ORAL CONTRACEPTIVE PILL(S) in past-mood changes.    Past Medical History:  Diagnosis Date   Acne    ADHD    Allergy    Anxiety    Depression    Environmental allergies    History of recurrent UTIs    Hives    Innocent heart murmur     Past Surgical History:  Procedure Laterality Date   WISDOM TOOTH EXTRACTION  2020    Outpatient Medications Prior to Visit  Medication Sig Dispense Refill   mirtazapine (REMERON) 7.5 MG tablet Take 1 tablet (7.5 mg total) by mouth at bedtime. 90 tablet 3   albuterol (VENTOLIN HFA) 108 (90 Base) MCG/ACT inhaler Inhale 1-2 puffs into the lungs every 4 (four) hours as needed for wheezing or shortness of breath. 18 g 1   Fluticasone Furoate (ARNUITY ELLIPTA) 200 MCG/ACT AEPB Inhale 1 puff into the lungs daily in the afternoon. 90 each 3   Lisdexamfetamine Dimesylate 50 MG  CHEW Chew 1 tablet (50 mg total) by mouth daily in the afternoon. 30 tablet 0   No facility-administered medications prior to visit.    Allergies  Allergen Reactions   Amoxicillin Anaphylaxis   Other Hives and Anaphylaxis    Carries Epi-Pen for when she gets hives. Source of hives is undetermined despite testing. Ragweed   Some acne.  On menses.     Objective:     Physical Exam  Vitals and nursing note reviewed.  Constitutional:      General:  is not in acute distress.    Appearance: Normal appearance.  HENT:     Head: Normocephalic.  Pulmonary:     Effort: No respiratory distress.  Skin:    General: Skin is dry.     Coloration: Skin is not pale.  Neurological:     Mental Status: Pt is alert and oriented to person, place, and time.  Psychiatric:        Mood and Affect: Mood normal.   There were no vitals taken for this visit.  Wt Readings from Last 3 Encounters:  05/15/22 120 lb 6.4 oz (54.6 kg)  04/06/21 110 lb (49.9 kg) (17 %, Z= -0.97)*  02/06/21 111 lb 12.4 oz (50.7 kg) (20 %, Z= -0.83)*   * Growth percentiles are based on CDC (Girls, 2-20 Years) data.       Assessment & Plan:   Problem  List Items Addressed This Visit       Respiratory   Extrinsic asthma - Primary   Relevant Medications   albuterol (VENTOLIN HFA) 108 (90 Base) MCG/ACT inhaler   Fluticasone Furoate (ARNUITY ELLIPTA) 200 MCG/ACT AEPB     Other   Metrorrhagia   Relevant Medications   norelgestromin-ethinyl estradiol [employer]) 150-35 MCG/24HR transdermal patch   Attention deficit hyperactivity disorder (ADHD), combined type   Relevant Medications   Lisdexamfetamine Dimesylate 50 MG CHEW   Lisdexamfetamine Dimesylate 50 MG CHEW (Start on 12/27/2022)   Lisdexamfetamine Dimesylate 50 MG CHEW (Start on 01/26/2023)   Asthma-chronic.  Controlled.  Continue arnuity daily and albuterol as needed ADHD-chronic.  Controlled.  Continue Vyvanse 50 mg-sent 3 prescription Metorrhagia/irreg-discussed  options.  Patient will do patches     Meds ordered this encounter  Medications   albuterol (VENTOLIN HFA) 108 (90 Base) MCG/ACT inhaler    Sig: Inhale 1-2 puffs into the lungs every 4 (four) hours as needed for wheezing or shortness of breath.    Dispense:  18 g    Refill:  1   Fluticasone Furoate (ARNUITY ELLIPTA) 200 MCG/ACT AEPB    Sig: Inhale 1 puff into the lungs daily in the afternoon.    Dispense:  90 each    Refill:  3   Lisdexamfetamine Dimesylate 50 MG CHEW    Sig: Chew 1 tablet (50 mg total) by mouth daily in the afternoon.    Dispense:  30 tablet    Refill:  0   Lisdexamfetamine Dimesylate 50 MG CHEW    Sig: Chew 1 tablet (50 mg total) by mouth daily in the afternoon.    Dispense:  30 tablet    Refill:  0   Lisdexamfetamine Dimesylate 50 MG CHEW    Sig: Chew 1 tablet (50 mg total) by mouth daily in the afternoon.    Dispense:  30 tablet    Refill:  0    Fill after 09/19/22   norelgestromin-ethinyl estradiol [employer]) 150-35 MCG/24HR transdermal patch    Sig: Place 1 patch onto the skin once a week.    Dispense:  9 patch    Refill:  4    I discussed the assessment and treatment plan with the patient. The patient was provided an opportunity to ask questions and all were answered. The patient agreed with the plan and demonstrated an understanding of the instructions.   The patient was advised to call back or seek an in-person evaluation if the symptoms worsen or if the condition fails to improve as anticipated.  No follow-ups on file.  Angelena Sole, MD [facility] [place] [phone] (phone) [phone] (fax)  [facility]

## 2022-12-11 ENCOUNTER — Ambulatory Visit: Payer: BC Managed Care – PPO | Admitting: Family Medicine

## 2023-01-08 DIAGNOSIS — Z113 Encounter for screening for infections with a predominantly sexual mode of transmission: Secondary | ICD-10-CM | POA: Diagnosis not present

## 2023-02-04 ENCOUNTER — Other Ambulatory Visit: Payer: Self-pay | Admitting: Family Medicine

## 2023-02-04 DIAGNOSIS — J4540 Moderate persistent asthma, uncomplicated: Secondary | ICD-10-CM

## 2023-02-19 DIAGNOSIS — U071 COVID-19: Secondary | ICD-10-CM | POA: Diagnosis not present

## 2023-02-19 DIAGNOSIS — Z20828 Contact with and (suspected) exposure to other viral communicable diseases: Secondary | ICD-10-CM | POA: Diagnosis not present

## 2023-02-19 DIAGNOSIS — Z681 Body mass index (BMI) 19 or less, adult: Secondary | ICD-10-CM | POA: Diagnosis not present

## 2023-03-25 ENCOUNTER — Telehealth: Payer: Self-pay | Admitting: Family Medicine

## 2023-03-25 ENCOUNTER — Other Ambulatory Visit: Payer: Self-pay | Admitting: Family Medicine

## 2023-03-25 DIAGNOSIS — F902 Attention-deficit hyperactivity disorder, combined type: Secondary | ICD-10-CM

## 2023-03-25 MED ORDER — LISDEXAMFETAMINE DIMESYLATE 50 MG PO CHEW
50.0000 mg | CHEWABLE_TABLET | Freq: Every day | ORAL | 0 refills | Status: DC
Start: 2023-03-25 — End: 2023-04-03

## 2023-03-25 NOTE — Telephone Encounter (Signed)
Prescription Request  03/25/2023  LOV: 05/15/2022  PT HAS OV ON 10/9 FOR F/U. Doesn't have anymore medication, can enough meds to last until her OV be sent in?  What is the name of the medication or equipment? Lisdexamfetamine Dimesylate 50 MG CHEW   Have you contacted your pharmacy to request a refill? Yes   Which pharmacy would you like this sent to?  [id number] [address], [place] - [phone] AT [facility] [address] [place] [address] Phone: [phone] Fax: [phone]    Patient notified that their request is being sent to the clinical staff for review and that they should receive a response within 2 business days.   Please advise at Mobile [phone] (mobile)

## 2023-03-25 NOTE — Telephone Encounter (Signed)
Please see message below

## 2023-03-26 NOTE — Telephone Encounter (Signed)
Noted  

## 2023-04-02 ENCOUNTER — Telehealth: Payer: Self-pay | Admitting: Family Medicine

## 2023-04-02 NOTE — Telephone Encounter (Signed)
Following patient requesting her OV for 10/9 be changed to a virtual visit. I informed pt that pcp approved this visit to be virtual but her next visit has to be in person so the medication can continue to be refilled. Pt verbalized understanding.

## 2023-04-02 NOTE — Telephone Encounter (Signed)
Noted  

## 2023-04-03 ENCOUNTER — Encounter: Payer: Self-pay | Admitting: Family Medicine

## 2023-04-03 ENCOUNTER — Telehealth (INDEPENDENT_AMBULATORY_CARE_PROVIDER_SITE_OTHER): Payer: BC Managed Care – PPO | Admitting: Family Medicine

## 2023-04-03 DIAGNOSIS — F902 Attention-deficit hyperactivity disorder, combined type: Secondary | ICD-10-CM | POA: Diagnosis not present

## 2023-04-03 MED ORDER — LISDEXAMFETAMINE DIMESYLATE 50 MG PO CHEW
50.0000 mg | CHEWABLE_TABLET | Freq: Every day | ORAL | 0 refills | Status: DC
Start: 2023-04-03 — End: 2023-06-18

## 2023-04-03 MED ORDER — LISDEXAMFETAMINE DIMESYLATE 50 MG PO CHEW: 50.0000 mg | CHEWABLE_TABLET | Freq: Every day | ORAL | 0 refills | Status: DC

## 2023-04-03 NOTE — Progress Notes (Signed)
MyChart Video Visit Virtual Visit via Video Note   This visit type was conducted w/patient consent. This format is felt to be most appropriate for this patient at this time. Physical exam was limited by quality of the video and audio technology used for the visit. CMA was able to get the patient set up on a video visit.  Patient location: Home. Patient and provider in visit Provider location: Office  I discussed the limitations of evaluation and management by telemedicine and the availability of in person appointments. The patient expressed understanding and agreed to proceed.  Visit Date: 04/03/2023  Today's healthcare provider: Angelena Sole, MD     Subjective:    Patient ID: Sheryl Jackson, female    DOB: June 10, 2001, 22 y.o.   MRN: [id number]  Chief Complaint  Patient presents with   Medication Refill    HPI ADHD-doing well on vyvanse 50mg .  No SI.    Past Medical History:  Diagnosis Date   Acne    ADHD    Allergy    Anxiety    Depression    Environmental allergies    History of recurrent UTIs    Hives    Innocent heart murmur     Past Surgical History:  Procedure Laterality Date   WISDOM TOOTH EXTRACTION  2020    Outpatient Medications Prior to Visit  Medication Sig Dispense Refill   Fluticasone Furoate (ARNUITY ELLIPTA) 200 MCG/ACT AEPB Inhale 1 puff into the lungs daily in the afternoon. 90 each 3   Lisdexamfetamine Dimesylate 50 MG CHEW Chew 1 tablet (50 mg total) by mouth daily in the afternoon. 30 tablet 0   VENTOLIN HFA 108 (90 Base) MCG/ACT inhaler INHALE 1 TO 2 PUFFS INTO THE LUNGS EVERY 4 HOURS AS NEEDED FOR WHEEZING OR SHORTNESS OF BREATH 18 g 1   lisdexamfetamine (VYVANSE) 30 MG capsule Vyvanse Take No date recorded No form recorded No frequency recorded No route recorded No set duration recorded No set duration amount recorded active No dosage strength recorded No dosage strength units of measure recorded     mirtazapine (REMERON) 7.5 MG tablet Take 1  tablet (7.5 mg total) by mouth at bedtime. 90 tablet 3   norelgestromin-ethinyl estradiol (XULANE) 150-35 MCG/24HR transdermal patch Place 1 patch onto the skin once a week. 9 patch 4   No facility-administered medications prior to visit.    Allergies  Allergen Reactions   Amoxicillin Anaphylaxis   Other Hives and Anaphylaxis    Carries Epi-Pen for when she gets hives. Source of hives is undetermined despite testing. Ragweed        Objective:     Physical Exam  Vitals and nursing note reviewed.  Constitutional:      General:  is not in acute distress.    Appearance: Normal appearance.  HENT:     Head: Normocephalic.  Pulmonary:     Effort: No respiratory distress.  Skin:    General: Skin is dry.     Coloration: Skin is not pale.  Neurological:     Mental Status: Pt is alert and oriented to person, place, and time.  Psychiatric:        Mood and Affect: Mood normal.   Pdmp checked  There were no vitals taken for this visit.  Wt Readings from Last 3 Encounters:  05/15/22 120 lb 6.4 oz (54.6 kg)  04/06/21 110 lb (49.9 kg) (17%, Z= -0.97)*  02/06/21 111 lb 12.4 oz (50.7 kg) (20%, Z= -0.83)*   *  Growth percentiles are based on CDC (Girls, 2-20 Years) data.       Assessment & Plan:   Problem List Items Addressed This Visit       Other   Attention deficit hyperactivity disorder (ADHD), combined type - Primary    Chronic.  Controlled.  Continue vyvanse 50mg .  3 rx sent to pharm.  Aware next appt HAS to be in person and uds       No orders of the defined types were placed in this encounter.   I discussed the assessment and treatment plan with the patient. The patient was provided an opportunity to ask questions and all were answered. The patient agreed with the plan and demonstrated an understanding of the instructions.   The patient was advised to call back or seek an in-person evaluation if the symptoms worsen or if the condition fails to improve as  anticipated.  No follow-ups on file.  Angelena Sole, MD [facility] [place] [phone] (phone) [phone] (fax)  [facility]

## 2023-04-03 NOTE — Assessment & Plan Note (Signed)
Chronic.  Controlled.  Continue vyvanse 50mg .  3 rx sent to pharm.  Aware next appt HAS to be in person and uds

## 2023-06-17 ENCOUNTER — Ambulatory Visit: Payer: BC Managed Care – PPO | Admitting: Family Medicine

## 2023-06-18 ENCOUNTER — Encounter: Payer: Self-pay | Admitting: Family Medicine

## 2023-06-18 ENCOUNTER — Other Ambulatory Visit (HOSPITAL_COMMUNITY)
Admission: RE | Admit: 2023-06-18 | Discharge: 2023-06-18 | Disposition: A | Discharge status: Home / Self Care | Payer: BC Managed Care – PPO | Source: Ambulatory Visit | Attending: Family Medicine | Admitting: Family Medicine

## 2023-06-18 ENCOUNTER — Ambulatory Visit: Payer: BC Managed Care – PPO | Admitting: Family Medicine

## 2023-06-18 VITALS — BP 107/60 | HR 77 | Temp 98.8°F | Resp 16 | Ht 66.0 in | Wt 124.0 lb

## 2023-06-18 DIAGNOSIS — Z113 Encounter for screening for infections with a predominantly sexual mode of transmission: Secondary | ICD-10-CM

## 2023-06-18 DIAGNOSIS — F902 Attention-deficit hyperactivity disorder, combined type: Secondary | ICD-10-CM | POA: Diagnosis not present

## 2023-06-18 MED ORDER — LISDEXAMFETAMINE DIMESYLATE 40 MG PO CHEW: 1.0000 | CHEWABLE_TABLET | Freq: Every day | ORAL | 0 refills | Status: DC

## 2023-06-18 NOTE — Progress Notes (Signed)
   Subjective:     Patient ID: Sheryl Jackson, female    DOB: 2000-08-14, 22 y.o.   MRN: [id number]  Chief Complaint  Patient presents with   Medication Follow-up    Follow-up for vyvanse, discuss change in dose     HPI ADHD-on vyvanse 50mg  daily.  Feeling like not needing 50mg  daily as not in school.  Breaking the 50mg  in 1/2 frequently and not enuf so breaking off "the corner" and ok.  No CP/palp, SI.  Back pain and tension-thinks from vyvanse.   Wart on foot-otc meds.    +SA-condoms.  Wants to check on sti by urine. No symptoms  Health Maintenance Due  Topic Date Due   Meningococcal B Vaccine  Never done   CHLAMYDIA SCREENING  12/12/2018    Past Medical History:  Diagnosis Date   Acne    ADHD    Allergy    Anxiety    Depression    Environmental allergies    History of recurrent UTIs    Hives    Innocent heart murmur     Past Surgical History:  Procedure Laterality Date   WISDOM TOOTH EXTRACTION  2020     Current Outpatient Medications:    Fluticasone Furoate (ARNUITY ELLIPTA) 200 MCG/ACT AEPB, Inhale 1 puff into the lungs daily in the afternoon., Disp: 90 each, Rfl: 3   VENTOLIN HFA 108 (90 Base) MCG/ACT inhaler, INHALE 1 TO 2 PUFFS INTO THE LUNGS EVERY 4 HOURS AS NEEDED FOR WHEEZING OR SHORTNESS OF BREATH, Disp: 18 g, Rfl: 1  Allergies  Allergen Reactions   Amoxicillin Anaphylaxis   Other Hives and Anaphylaxis    Carries Epi-Pen for when she gets hives. Source of hives is undetermined despite testing. Ragweed   ROS neg/noncontributory except as noted HPI/below      Objective:     BP 107/60   Pulse 77   Temp 98.8 F (37.1 C) (Temporal)   Resp 16   Ht 5\' 6"  (1.676 m)   Wt 124 lb (56.2 kg)   SpO2 98%   BMI 20.01 kg/m  Wt Readings from Last 3 Encounters:  06/18/23 124 lb (56.2 kg)  05/15/22 120 lb 6.4 oz (54.6 kg)  04/06/21 110 lb (49.9 kg) (17%, Z= -0.97)*   * Growth percentiles are based on CDC (Girls, 2-20 Years) data.    Physical  Exam   Gen: WDWN NAD HEENT: NCAT, conjunctiva not injected, sclera nonicteric NECK:  supple, no thyromegaly, no nodes, no carotid bruits CARDIAC: RRR, S1S2+, no murmur. DP 2+B LUNGS: CTAB. No wheezes ABDOMEN:  BS+, soft, NTND, No HSM, no masses EXT:  no edema MSK: no gross abnormalities.  NEURO: A&O x3.  CN II-XII intact.  PSYCH: normal mood. Good eye contact  L foot lesion sole in center-several dots.  Approx 7mm.    Pdmp checked     Assessment & Plan:  Attention deficit hyperactivity disorder (ADHD), combined type Assessment & Plan: Chronic.  Controlled on vyvanse but wants to decrease to 40mg .  3 rx sent.  Message in 3 months and f/u in 6 mo video  Orders: -     DRUG MONITORING, PANEL 8 WITH CONFIRMATION, URINE  Routine screening for STI (sexually transmitted infection) -     Urine cytology ancillary only    Return in about 6 months (around 12/17/2023) for video for ADD.  Angelena Sole, MD

## 2023-06-18 NOTE — Assessment & Plan Note (Signed)
Chronic.  Controlled on vyvanse but wants to decrease to 40mg .  3 rx sent.  Message in 3 months and f/u in 6 mo video

## 2023-06-18 NOTE — Patient Instructions (Signed)
It was very nice to see you today!  Happy Holidays!  Message in 3 months for refill   PLEASE NOTE:  If you had any lab tests please let us know if you have not heard back within a few days. You may see your results on MyChart before we have a chance to review them but we will give you a call once they are reviewed by [place]. If we ordered any referrals today, please let us know if you have not heard from their office within the next week.   Please try these tips to maintain a healthy lifestyle:  Eat most of your calories during the day when you are active. Eliminate processed foods including packaged sweets (pies, cakes, cookies), reduce intake of potatoes, white bread, white pasta, and white rice. Look for whole grain options, oat flour or almond flour.  Each meal should contain half fruits/vegetables, one quarter protein, and one quarter carbs (no bigger than a computer mouse).  Cut down on sweet beverages. This includes juice, soda, and sweet tea. Also watch fruit intake, though this is a healthier sweet option, it still contains natural sugar! Limit to 3 servings daily.  Drink at least 1 glass of water with each meal and aim for at least 8 glasses per day  Exercise at least 150 minutes every week.

## 2023-06-21 LAB — DRUG MONITORING, PANEL 8 WITH CONFIRMATION, URINE
6 Acetylmorphine: NEGATIVE ng/mL (ref <10)
Alcohol Metabolites: POSITIVE ng/mL — AB (ref <500)
Amphetamine: 677 ng/mL — ABNORMAL HIGH (ref <250)
Amphetamines: POSITIVE ng/mL — AB (ref <500)
Benzodiazepines: NEGATIVE ng/mL (ref <100)
Buprenorphine, Urine: NEGATIVE ng/mL (ref <5)
Cocaine Metabolite: NEGATIVE ng/mL (ref <150)
Creatinine: 111.5 mg/dL (ref >=20.0)
Ethyl Glucuronide (ETG): 2088 ng/mL — ABNORMAL HIGH (ref <500)
Ethyl Sulfate (ETS): 473 ng/mL — ABNORMAL HIGH (ref <100)
MDMA: NEGATIVE ng/mL (ref <500)
Marijuana Metabolite: 2371 ng/mL — ABNORMAL HIGH (ref <5)
Marijuana Metabolite: POSITIVE ng/mL — AB (ref <20)
Methamphetamine: NEGATIVE ng/mL (ref <250)
Opiates: NEGATIVE ng/mL (ref <100)
Oxidant: NEGATIVE ug/mL (ref <200)
Oxycodone: NEGATIVE ng/mL (ref <100)
pH: 7.2 (ref 4.5–9.0)

## 2023-06-21 LAB — DM TEMPLATE

## 2023-06-23 ENCOUNTER — Encounter: Payer: Self-pay | Admitting: Family Medicine

## 2023-06-23 NOTE — Progress Notes (Signed)
I am concerned about your alcohol use as high 2 years in a row.  Do you need help?

## 2023-06-24 LAB — URINE CYTOLOGY ANCILLARY ONLY
Chlamydia: NEGATIVE
Comment: NEGATIVE
Comment: NEGATIVE
Comment: NORMAL
Neisseria Gonorrhea: NEGATIVE
Trichomonas: NEGATIVE

## 2023-08-09 ENCOUNTER — Other Ambulatory Visit: Payer: Self-pay | Admitting: Family Medicine

## 2023-08-09 DIAGNOSIS — J4540 Moderate persistent asthma, uncomplicated: Secondary | ICD-10-CM

## 2023-09-21 ENCOUNTER — Encounter: Payer: Self-pay | Admitting: Family Medicine

## 2023-10-02 ENCOUNTER — Other Ambulatory Visit: Payer: Self-pay | Admitting: Family Medicine

## 2023-10-02 MED ORDER — LISDEXAMFETAMINE DIMESYLATE 40 MG PO CHEW: 40.0000 mg | CHEWABLE_TABLET | Freq: Every day | ORAL | 0 refills | Status: DC

## 2023-10-02 NOTE — Telephone Encounter (Signed)
 Copied from CRM [id number]. Topic: Clinical - Medication Refill >> Oct 02, 2023 11:19 AM Randa Ngo wrote: Most Recent Primary Care Visit:  Provider: Lutricia Horsfall MARIE  Department: [facility]  Visit Type: OFFICE VISIT  Date: 06/18/2023  Medication: Lisdexamfetamine Dimesylate (VYVANSE) 40 MG CHEW  Has the patient contacted their pharmacy? Yes, advised to call PCP office. (Agent: If no, request that the patient contact the pharmacy for the refill. If patient does not wish to contact the pharmacy document the reason why and proceed with request.) (Agent: If yes, when and what did the pharmacy advise?)  Is this the correct pharmacy for this prescription? Yes If no, delete pharmacy and type the correct one.  This is the patient's preferred pharmacy:  [id number] [place], [place] - [address] AT [facility] [address] [place] [address] Phone: [phone] Fax: [phone]   Has the prescription been filled recently? No  Is the patient out of the medication? Yes  Has the patient been seen for an appointment in the last year OR does the patient have an upcoming appointment? Yes  Can we respond through MyChart? Yes  Agent: Please be advised that Rx refills may take up to 3 business days. We ask that you follow-up with your pharmacy.

## 2023-11-05 ENCOUNTER — Encounter: Payer: Self-pay | Admitting: Family Medicine

## 2023-11-06 ENCOUNTER — Other Ambulatory Visit: Payer: Self-pay | Admitting: Family Medicine

## 2023-11-06 MED ORDER — LISDEXAMFETAMINE DIMESYLATE 40 MG PO CHEW: 1.0000 | CHEWABLE_TABLET | Freq: Every day | ORAL | 0 refills | Status: DC

## 2023-11-19 ENCOUNTER — Other Ambulatory Visit: Payer: Self-pay | Admitting: Family Medicine

## 2023-11-19 DIAGNOSIS — J4540 Moderate persistent asthma, uncomplicated: Secondary | ICD-10-CM

## 2023-12-18 ENCOUNTER — Other Ambulatory Visit: Payer: Self-pay | Admitting: Family Medicine

## 2023-12-18 DIAGNOSIS — J4540 Moderate persistent asthma, uncomplicated: Secondary | ICD-10-CM

## 2023-12-18 NOTE — Telephone Encounter (Signed)
 Left detailed message to return call to office.

## 2024-01-01 ENCOUNTER — Encounter: Payer: Self-pay | Admitting: Family Medicine

## 2024-01-01 ENCOUNTER — Telehealth: Payer: Self-pay | Admitting: Family Medicine

## 2024-01-01 MED ORDER — LISDEXAMFETAMINE DIMESYLATE 40 MG PO CHEW: 40.0000 mg | CHEWABLE_TABLET | Freq: Every day | ORAL | 0 refills | Status: DC

## 2024-01-01 NOTE — Telephone Encounter (Signed)
 Copied from CRM [id number]. Topic: Clinical - Medication Refill >> Jan 01, 2024  2:07 PM Geroldine GRADE wrote: Medication: Lisdexamfetamine Dimesylate  (VYVANSE ) 40 MG CHEW   Has the patient contacted their pharmacy? Yes (Agent: If no, request that the patient contact the pharmacy for the refill. If patient does not wish to contact the pharmacy document the reason why and proceed with request.) (Agent: If yes, when and what did the pharmacy advise?)  This is the patient's preferred pharmacy:  [facility] #[id number] [place], [place] - [address] AT [facility] & [address] [place] [address] Phone: [phone] Fax: [phone]  Is this the correct pharmacy for this prescription? Yes If no, delete pharmacy and type the correct one.   Has the prescription been filled recently? Yes,12/01/23  Is the patient out of the medication? Yes,took last one   Has the patient been seen for an appointment in the last year OR does the patient have an upcoming appointment? Yes  Can we respond through MyChart? Yes  Agent: Please be advised that Rx refills may take up to 3 business days. We ask that you follow-up with your pharmacy.

## 2024-01-22 ENCOUNTER — Telehealth: Payer: Self-pay | Admitting: *Deleted

## 2024-01-22 ENCOUNTER — Other Ambulatory Visit: Payer: Self-pay | Admitting: Family Medicine

## 2024-01-22 MED ORDER — LISDEXAMFETAMINE DIMESYLATE 40 MG PO CHEW: 40.0000 mg | CHEWABLE_TABLET | Freq: Every day | ORAL | 0 refills | Status: DC

## 2024-01-22 NOTE — Telephone Encounter (Signed)
 Noted

## 2024-01-22 NOTE — Telephone Encounter (Signed)
 Copied from CRM [id number]. Topic: Clinical - Medication Question >> Jan 22, 2024  2:01 PM Franky GRADE wrote: Reason for CRM: Patient scheduled the video visit for a refill on Lisdexamfetamine Dimesylate  (VYVANSE ) 40 MG CHEW; however, it's not until 02/05/2024. She would like to know if a refill to hold her over can be sent to the pharmacy as she is completely out of the medication.

## 2024-02-05 ENCOUNTER — Encounter: Payer: Self-pay | Admitting: Family Medicine

## 2024-02-05 ENCOUNTER — Telehealth: Admitting: Family Medicine

## 2024-02-05 DIAGNOSIS — F902 Attention-deficit hyperactivity disorder, combined type: Secondary | ICD-10-CM | POA: Diagnosis not present

## 2024-02-05 DIAGNOSIS — R21 Rash and other nonspecific skin eruption: Secondary | ICD-10-CM | POA: Diagnosis not present

## 2024-02-05 MED ORDER — LISDEXAMFETAMINE DIMESYLATE 40 MG PO CHEW: 40.0000 mg | CHEWABLE_TABLET | Freq: Every day | ORAL | 0 refills | Status: DC

## 2024-02-05 MED ORDER — FLUCONAZOLE 150 MG PO TABS: 150.0000 mg | ORAL_TABLET | Freq: Every day | ORAL | 1 refills | Status: DC

## 2024-02-05 NOTE — Progress Notes (Signed)
 MyChart Video Visit Virtual Visit via Video Note   This visit type was conducted w/patient consent. This format is felt to be most appropriate for this patient at this time. Physical exam was limited by quality of the video and audio technology used for the visit. CMA was able to get the patient set up on a video visit.  Patient location: work. Patient and provider in visit Provider location: Office  I discussed the limitations of evaluation and management by telemedicine and the availability of in person appointments. The patient expressed understanding and agreed to proceed.  Visit Date: 02/05/2024  Today's healthcare provider: Jenkins CHRISTELLA Carrel, MD     Subjective:    Patient ID: Sheryl Jackson, female    DOB: Dec 17, 2000, 23 y.o.   MRN: [id number]  Chief Complaint  Patient presents with   Medication Refill    Need refill of Vyvanse      HPI Discussed the use of AI scribe software for clinical note transcription with the patient, who gave verbal consent to proceed.  History of Present Illness Sheryl Jackson is a 23 year old female who presents for medication management and evaluation of ADHD  Asthma-She uses albuterol  as needed and has not required Arnuity for almost a year.   ADHD-She continues to take Vyvanse  40 mg daily, which she finds effective, though a lower dose has helped reduce muscle tension and back pain. No heart racing, shakiness, or jitteriness nor SI  She experiences recurrent skin issues every summer since moving to her current location, characterized by small white bumps on her hands and stomach, resembling sunspots. These are not raised and are typically managed with Diflucan , which she prefers over topical creams.  She has been experiencing severe hives all over her body due to heat, which improve with antihistamines and cooler weather. The hives were particularly bad during the hot summer months but have improved as the weather has cooled.  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 She is working on Advertising copywriter and plans to take an exam in October. She resides in [place] and prefers to pick up her medications from [facility] there.    Past Medical History:  Diagnosis Date   Acne    ADHD    Allergy    Anxiety    Depression    Environmental allergies    History of recurrent UTIs    Hives    Innocent heart murmur     Past Surgical History:  Procedure Laterality Date   WISDOM TOOTH EXTRACTION  2020    Outpatient Medications Prior to Visit  Medication Sig Dispense Refill   albuterol  (VENTOLIN  HFA) 108 (90 Base) MCG/ACT inhaler INHALE 1 TO 2 PUFFS INTO THE LUNGS EVERY 4 HOURS AS NEEDED FOR WHEEZING OR SHORTNESS OF BREATH 18 g 1   Fluticasone  Furoate (ARNUITY ELLIPTA ) 200 MCG/ACT AEPB Inhale 1 puff into the lungs daily in the afternoon. 90 each 3   Lisdexamfetamine Dimesylate  (VYVANSE ) 40 MG CHEW Chew 1 tablet (40 mg total) by mouth daily. 30 tablet 0   No facility-administered medications prior to visit.    Allergies  Allergen Reactions   Amoxicillin  Anaphylaxis   Other Hives and Anaphylaxis    Carries Epi-Pen for when she gets hives. Source of hives is undetermined despite testing. Ragweed        Objective:     Physical Exam  Vitals and nursing note reviewed.  Constitutional:  General:  is not in acute distress.    Appearance: Normal appearance.  HENT:     Head: Normocephalic.  Pulmonary:     Effort: No respiratory distress.  Skin:    General: Skin is dry.     Coloration: Skin is not pale.  Neurological:     Mental Status: Pt is alert and oriented to person, place, and time.  Psychiatric:        Mood and Affect: Mood normal.   LMP 12/24/2023 (Approximate)   Wt Readings from Last 3 Encounters:  06/18/23 124 lb (56.2 kg)  05/15/22 120 lb 6.4 oz (54.6 kg)  04/06/21 110 lb (49.9 kg) (17%, Z= -0.97)*   * Growth percentiles are based on CDC (Girls,  2-20 Years) data.       Assessment & Plan:   Problem List Items Addressed This Visit     Attention deficit hyperactivity disorder (ADHD), combined type - Primary   Other Visit Diagnoses       Rash         Assessment and Plan Assessment & Plan Asthma   Asthma is well-controlled with albuterol  as needed, and Arnuity has not been required for almost a year. Continue albuterol  as needed and keep Arnuity on file for potential future use.  Attention-deficit hyperactivity disorder (ADHD)   ADHD is effectively managed with Vyvanse  40 mg daily  well controlled. PDMP checked.  3 rx sent.   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 Prefers oral treatment over topical creams. Prescribe Diflucan  (fluconazole ) for tinea versicolor.  Recurrent heat-induced urticaria   Recurrent heat-induced urticaria is exacerbated by high temperatures. Symptoms have improved with cooler weather and antihistamines. Continue antihistamines as needed and contact if she becomes ineffective.    Meds ordered this encounter  Medications   fluconazole  (DIFLUCAN ) 150 MG tablet    Sig: Take 1 tablet (150 mg total) by mouth daily. May repeat in 3 days if needed.    Dispense:  2 tablet    Refill:  1   Lisdexamfetamine Dimesylate  (VYVANSE ) 40 MG CHEW    Sig: Chew 1 tablet (40 mg total) by mouth daily.    Dispense:  30 tablet    Refill:  0   Lisdexamfetamine Dimesylate  (VYVANSE ) 40 MG CHEW    Sig: Chew 1 tablet (40 mg total) by mouth daily.    Dispense:  30 tablet    Refill:  0   Lisdexamfetamine Dimesylate  (VYVANSE ) 40 MG CHEW    Sig: Chew 1 tablet (40 mg total) by mouth daily.    Dispense:  30 tablet    Refill:  0    I discussed the assessment and treatment plan with the patient. The patient was provided an opportunity to ask questions and all were answered. The patient  agreed with the plan and demonstrated an understanding of the instructions.   The patient was advised to call back or seek an in-person evaluation if the symptoms worsen or if the condition fails to improve as anticipated.  Return in about 3 months (around 05/07/2024) for adhd-in person.  Jenkins CHRISTELLA Carrel, MD [facility] [phone] (phone) [phone] (fax)  [facility]

## 2024-02-05 NOTE — Patient Instructions (Signed)

## 2024-03-18 DIAGNOSIS — R21 Rash and other nonspecific skin eruption: Secondary | ICD-10-CM | POA: Diagnosis not present

## 2024-03-25 DIAGNOSIS — L508 Other urticaria: Secondary | ICD-10-CM | POA: Diagnosis not present

## 2024-04-11 DIAGNOSIS — F10929 Alcohol use, unspecified with intoxication, unspecified: Secondary | ICD-10-CM | POA: Diagnosis not present

## 2024-04-11 DIAGNOSIS — Z139 Encounter for screening, unspecified: Secondary | ICD-10-CM | POA: Diagnosis not present

## 2024-04-11 DIAGNOSIS — F10920 Alcohol use, unspecified with intoxication, uncomplicated: Secondary | ICD-10-CM | POA: Diagnosis not present

## 2024-04-11 DIAGNOSIS — R410 Disorientation, unspecified: Secondary | ICD-10-CM | POA: Diagnosis not present

## 2024-04-11 DIAGNOSIS — F10120 Alcohol abuse with intoxication, uncomplicated: Secondary | ICD-10-CM | POA: Diagnosis not present

## 2024-04-11 DIAGNOSIS — R4182 Altered mental status, unspecified: Secondary | ICD-10-CM | POA: Diagnosis not present

## 2024-04-11 DIAGNOSIS — R45851 Suicidal ideations: Secondary | ICD-10-CM | POA: Diagnosis not present

## 2024-04-11 DIAGNOSIS — R456 Violent behavior: Secondary | ICD-10-CM | POA: Diagnosis not present

## 2024-04-17 ENCOUNTER — Telehealth: Payer: Self-pay | Admitting: Family Medicine

## 2024-04-17 NOTE — Telephone Encounter (Unsigned)
 Copied from CRM [id number]. Topic: Clinical - Medication Refill >> Apr 17, 2024  2:52 PM Harlene ORN wrote: Medication: Lisdexamfetamine Dimesylate  (VYVANSE ) 40 MG CHEW  Has the patient contacted their pharmacy? Yes (Agent: If no, request that the patient contact the pharmacy for the refill. If patient does not wish to contact the pharmacy document the reason why and proceed with request.) (Agent: If yes, when and what did the pharmacy advise?)  This is the patient's preferred pharmacy:  [facility] #[id number] [place], [place] - [address] AT [facility] & [address] [place] [address] Phone: [phone] Fax: [phone]  Is this the correct pharmacy for this prescription? Yes If no, delete pharmacy and type the correct one.   Has the prescription been filled recently? No  Is the patient out of the medication? Yes  Has the patient been seen for an appointment in the last year OR does the patient have an upcoming appointment? Yes  Can we respond through MyChart? Yes  Agent: Please be advised that Rx refills may take up to 3 business days. We ask that you follow-up with your pharmacy.

## 2024-04-25 DIAGNOSIS — S61451A Open bite of right hand, initial encounter: Secondary | ICD-10-CM | POA: Diagnosis not present

## 2024-05-13 DIAGNOSIS — L700 Acne vulgaris: Secondary | ICD-10-CM | POA: Diagnosis not present

## 2024-05-13 DIAGNOSIS — L2089 Other atopic dermatitis: Secondary | ICD-10-CM | POA: Diagnosis not present

## 2024-05-13 DIAGNOSIS — L408 Other psoriasis: Secondary | ICD-10-CM | POA: Diagnosis not present

## 2024-05-25 ENCOUNTER — Ambulatory Visit: Payer: Self-pay

## 2024-05-25 ENCOUNTER — Other Ambulatory Visit: Payer: Self-pay

## 2024-05-25 ENCOUNTER — Encounter: Payer: Self-pay | Admitting: Family Medicine

## 2024-05-25 ENCOUNTER — Other Ambulatory Visit: Payer: Self-pay | Admitting: Family

## 2024-05-25 MED ORDER — LISDEXAMFETAMINE DIMESYLATE 40 MG PO CHEW: 40.0000 mg | CHEWABLE_TABLET | Freq: Every day | ORAL | 0 refills | Status: DC

## 2024-05-25 NOTE — Telephone Encounter (Signed)
 Call placed to patient-no answer. Called to see if patient knew that medication refill had been sent in on her medication. Voicemail left to call back to Nurse Triage.

## 2024-05-25 NOTE — Telephone Encounter (Signed)
 Copied from CRM #[id number]. Topic: Clinical - Red Word Triage >> May 25, 2024  2:14 PM Sheryl Jackson wrote: Red Word that prompted transfer to Nurse Triage: Patient has been out of her medication, no motivation to do anything, sleeping for 12 hours. Her symptoms are getting worse. Vyvanse 

## 2024-06-27 ENCOUNTER — Encounter: Payer: Self-pay | Admitting: Family Medicine

## 2024-06-29 ENCOUNTER — Other Ambulatory Visit: Payer: Self-pay | Admitting: Family Medicine

## 2024-06-29 MED ORDER — LISDEXAMFETAMINE DIMESYLATE 40 MG PO CHEW: 40.0000 mg | CHEWABLE_TABLET | Freq: Every day | ORAL | 0 refills | Status: DC

## 2024-07-06 ENCOUNTER — Ambulatory Visit: Admitting: Family Medicine

## 2024-07-06 ENCOUNTER — Encounter: Payer: Self-pay | Admitting: Family Medicine

## 2024-07-06 VITALS — BP 108/64 | HR 68 | Temp 96.6°F | Ht 66.0 in | Wt 121.5 lb

## 2024-07-06 DIAGNOSIS — F902 Attention-deficit hyperactivity disorder, combined type: Secondary | ICD-10-CM

## 2024-07-06 DIAGNOSIS — Z79899 Other long term (current) drug therapy: Secondary | ICD-10-CM

## 2024-07-06 DIAGNOSIS — J4520 Mild intermittent asthma, uncomplicated: Secondary | ICD-10-CM

## 2024-07-06 MED ORDER — LISDEXAMFETAMINE DIMESYLATE 40 MG PO CHEW: 40.0000 mg | CHEWABLE_TABLET | Freq: Every day | ORAL | 0 refills | Status: AC

## 2024-07-06 NOTE — Progress Notes (Signed)
 "  Subjective:     Patient ID: Sheryl Jackson, female    DOB: 07-Sep-2000, 24 y.o.   MRN: [id number]  Chief Complaint  Patient presents with   ADHD    Pt is here for Medication refill    Discussed the use of AI scribe software for clinical note transcription with the patient, who gave verbal consent to proceed.  History of Present Illness Sheryl Jackson is a 24 year old female with ADHD and asthma who presents for a follow-up visit.  She is currently taking Vyvanse  40 mg daily for ADHD. She reports that she does not experience the anger or mood changes that she had with Adderall and Concerta. She is satisfied with the current dose and occasionally takes half if she wakes up late. No side effects such as shakiness, jitteriness, heart racing, increased anxiety, trouble sleeping, or suicidal thoughts.  Regarding asthma, she does not regularly use Arnuity but keeps the prescription available in case her breathing worsens. She carries albuterol  primarily for anxiety in crowded places but rarely uses it. She exercises without issues and has a refill available for albuterol . The prescription for Arnuity has expired, but she has some at home.  She mentions a wart on the bottom of her left foot that was frozen off by a dermatologist before Thanksgiving. It appears to be improving, but she is considering further treatment as it may need additional freezing.  Socially, she recently graduated and completed a teacher, english as a foreign language. She is currently bartending and looking for paralegal jobs in [place], where she enjoys [place] [employer].    Health Maintenance Due  Topic Date Due   HPV VACCINES (3 - Risk 3-dose series) 11/20/2016   Cervical Cancer Screening (Pap smear)  Never done   CHLAMYDIA SCREENING  06/17/2024    Past Medical History:  Diagnosis Date   Acne    ADHD    Allergy    Anxiety    Depression    Environmental allergies    History of recurrent UTIs    Hives    Innocent  heart murmur     Past Surgical History:  Procedure Laterality Date   WISDOM TOOTH EXTRACTION  2020    Current Medications[1]  Allergies[2] ROS neg/noncontributory except as noted HPI/below      Objective:     BP 108/64 (BP Location: Left Arm, Patient Position: Sitting, Cuff Size: Normal)   Pulse 68   Temp (!) 96.6 F (35.9 C) (Temporal)   Ht 5' 6 (1.676 m)   Wt 121 lb 8 oz (55.1 kg)   LMP 06/15/2024 (Approximate)   SpO2 98%   BMI 19.61 kg/m  Wt Readings from Last 3 Encounters:  07/06/24 121 lb 8 oz (55.1 kg)  06/18/23 124 lb (56.2 kg)  05/15/22 120 lb 6.4 oz (54.6 kg)    Physical Exam GENERAL: Well developed, well nourished, no acute distress. HEAD EYES EARS NOSE THROAT: Normocephalic, atraumatic, conjunctiva not injected, sclera nonicteric. CARDIAC: Regular rate and rhythm, S1 S2 present, no murmur, dorsalis pedis 2 plus bilaterally. NECK: Supple, no thyromegaly, no nodes, no carotid bruits. LUNGS: Clear to auscultation bilaterally, no wheezes. ABDOMEN: Bowel sounds present, soft, non-tender, non-distended, no hepatosplenomegaly, no masses. EXTREMITIES: No edema. MUSCULOSKELETAL: No gross abnormalities. NEUROLOGICAL: Alert and oriented x3, cranial nerves II through XII intact. PSYCHIATRIC: Normal mood, good eye contact. SKIN: Wart on the bottom of the left foot, previously treated with cryotherapy, requires further treatment.       Assessment & Plan:  Attention deficit hyperactivity disorder (ADHD), combined type -     Urine drugs of abuse scrn w alc, routine (Ref Lab)  Mild intermittent extrinsic asthma without complication  High risk medication use -     Urine drugs of abuse scrn w alc, routine (Ref Lab)  Other orders -     Lisdexamfetamine  Dimesylate; Chew 1 tablet (40 mg total) by mouth daily.  Dispense: 30 tablet; Refill: 0 -     Lisdexamfetamine  Dimesylate; Chew 1 tablet (40 mg total) by mouth daily.  Dispense: 30 tablet; Refill: 0 -      Lisdexamfetamine  Dimesylate; Chew 1 tablet (40 mg total) by mouth daily.  Dispense: 30 tablet; Refill: 0    Assessment and Plan Assessment & Plan Attention-deficit hyperactivity disorder, combined type   ADHD is well-managed with Vyvanse  40 mg chewable tablets. Previous trials of Adderall and Concerta were not well-tolerated due to mood changes.  Continue Vyvanse  40 mg chewable tablets. A urine drug screen was performed as required for continued prescription. Schedule an annual in-office visit and biannual video visits. PDMP checked  Mild intermittent asthma   Asthma is well-controlled. She does not use Arnuity regularly but keeps it on hand for potential exacerbations. Albuterol  is used rarely, with no frequent use reported. Ensure albuterol  inhaler is available for use as needed. Contact the office if Arnuity prescription needs renewal.  Plantar wart, left foot   Plantar wart on the bottom of the left foot is improving but may require additional treatment. Previous cryotherapy was performed, but further intervention may be necessary due to thick skin. Schedule a follow-up appointment with another provider for potential additional cryotherapy.  General health maintenance   She is managing her health well with current medications and lifestyle. No new surgeries or significant changes in family history reported. Continue current health management strategies.     Return in about 6 months (around 01/03/2025) for ADHD-video.  Jenkins CHRISTELLA Carrel, MD     [1]  Current Outpatient Medications:    albuterol  (VENTOLIN  HFA) 108 (90 Base) MCG/ACT inhaler, INHALE 1 TO 2 PUFFS INTO THE LUNGS EVERY 4 HOURS AS NEEDED FOR WHEEZING OR SHORTNESS OF BREATH, Disp: 18 g, Rfl: 1   Fluticasone  Furoate (ARNUITY ELLIPTA ) 200 MCG/ACT AEPB, Inhale 1 puff into the lungs daily in the afternoon., Disp: 90 each, Rfl: 3   tretinoin (RETIN-A) 0.1 % cream, Apply 1 Application topically at bedtime., Disp: , Rfl:     Lisdexamfetamine  Dimesylate (VYVANSE ) 40 MG CHEW, Chew 1 tablet (40 mg total) by mouth daily., Disp: 30 tablet, Rfl: 0   [START ON 08/06/2024] Lisdexamfetamine  Dimesylate (VYVANSE ) 40 MG CHEW, Chew 1 tablet (40 mg total) by mouth daily., Disp: 30 tablet, Rfl: 0   [START ON 09/03/2024] Lisdexamfetamine  Dimesylate (VYVANSE ) 40 MG CHEW, Chew 1 tablet (40 mg total) by mouth daily., Disp: 30 tablet, Rfl: 0 [2]  Allergies Allergen Reactions   Amoxicillin  Anaphylaxis   Other Hives and Anaphylaxis    Carries Epi-Pen for when she gets hives. Source of hives is undetermined despite testing. Ragweed   "

## 2024-07-06 NOTE — Patient Instructions (Signed)

## 2024-07-10 ENCOUNTER — Ambulatory Visit: Payer: Self-pay | Admitting: Family Medicine

## 2024-07-10 LAB — URINE DRUGS OF ABUSE SCREEN W ALC, ROUTINE (REF LAB)
Amphetamines, Urine: NEGATIVE ng/mL
Barbiturate Quant, Ur: NEGATIVE ng/mL
Benzodiazepine Quant, Ur: NEGATIVE ng/mL
Cocaine (Metab.): NEGATIVE ng/mL
Creatinine, Urine: 25.4 mg/dL (ref 20.0–300.0)
Ethanol, Urine: NEGATIVE %
Methadone Screen, Urine: NEGATIVE ng/mL
Nitrite Urine, Quantitative: NEGATIVE ug/mL
OPIATE SCREEN URINE: NEGATIVE ng/mL
PCP Quant, Ur: NEGATIVE ng/mL
Propoxyphene: NEGATIVE ng/mL
pH, Urine: 7 (ref 4.5–8.9)

## 2024-07-10 LAB — PANEL 799049
CARBOXY THC GC/MS CONF: 587 ng/mL
Cannabinoid GC/MS, Ur: POSITIVE — AB
